# Patient Record
Sex: Female | Born: 1965 | Race: White | Hispanic: No | Marital: Married | Smoking: Former smoker
Health system: Southern US, Community
[De-identification: ages and names within clinical notes are randomized; demographics above are authoritative.]

## PROBLEM LIST (undated history)

## (undated) DIAGNOSIS — E785 Hyperlipidemia, unspecified: Secondary | ICD-10-CM

## (undated) DIAGNOSIS — I10 Essential (primary) hypertension: Secondary | ICD-10-CM

## (undated) DIAGNOSIS — E119 Type 2 diabetes mellitus without complications: Secondary | ICD-10-CM

## (undated) DIAGNOSIS — E079 Disorder of thyroid, unspecified: Secondary | ICD-10-CM

## (undated) HISTORY — PX: CYSTECTOMY: SUR359

## (undated) HISTORY — DX: Disorder of thyroid, unspecified: E07.9

## (undated) HISTORY — PX: ABDOMINAL HYSTERECTOMY: SHX81

## (undated) HISTORY — PX: BREAST BIOPSY: SHX20

---

## 2005-11-16 LAB — CONVERTED CEMR LAB

## 2007-01-25 ENCOUNTER — Ambulatory Visit: Payer: Self-pay | Admitting: Family Medicine

## 2007-01-25 DIAGNOSIS — K120 Recurrent oral aphthae: Secondary | ICD-10-CM | POA: Insufficient documentation

## 2007-01-25 DIAGNOSIS — E785 Hyperlipidemia, unspecified: Secondary | ICD-10-CM | POA: Insufficient documentation

## 2007-01-25 DIAGNOSIS — I10 Essential (primary) hypertension: Secondary | ICD-10-CM | POA: Insufficient documentation

## 2007-01-25 DIAGNOSIS — G43009 Migraine without aura, not intractable, without status migrainosus: Secondary | ICD-10-CM | POA: Insufficient documentation

## 2007-01-25 DIAGNOSIS — J309 Allergic rhinitis, unspecified: Secondary | ICD-10-CM | POA: Insufficient documentation

## 2007-01-25 DIAGNOSIS — M79609 Pain in unspecified limb: Secondary | ICD-10-CM | POA: Insufficient documentation

## 2007-01-25 DIAGNOSIS — E119 Type 2 diabetes mellitus without complications: Secondary | ICD-10-CM | POA: Insufficient documentation

## 2007-01-27 ENCOUNTER — Encounter: Payer: Self-pay | Admitting: Family Medicine

## 2007-01-28 ENCOUNTER — Ambulatory Visit: Payer: Self-pay | Admitting: Family Medicine

## 2007-01-28 DIAGNOSIS — R5383 Other fatigue: Secondary | ICD-10-CM

## 2007-01-28 DIAGNOSIS — R5381 Other malaise: Secondary | ICD-10-CM | POA: Insufficient documentation

## 2007-01-28 LAB — CONVERTED CEMR LAB
AST: 15 units/L (ref 0–37)
Albumin: 4.4 g/dL (ref 3.5–5.2)
BUN: 13 mg/dL (ref 6–23)
Calcium: 9 mg/dL (ref 8.4–10.5)
Chloride: 104 meq/L (ref 96–112)
Folate: 14.9 ng/mL
Glucose, Bld: 118 mg/dL — ABNORMAL HIGH (ref 70–99)
HDL: 40 mg/dL (ref >39)
Hemoglobin: 14.1 g/dL (ref 12.0–15.0)
Hgb A1c MFr Bld: 6 % (ref 4.6–6.1)
Potassium: 4.6 meq/L (ref 3.5–5.3)
RBC: 5.07 M/uL (ref 3.87–5.11)
RDW: 13.3 % (ref 11.5–14.0)
TSH: 5.328 microintl units/mL (ref 0.350–5.50)
Total Protein: 7.8 g/dL (ref 6.0–8.3)

## 2007-02-22 ENCOUNTER — Ambulatory Visit: Payer: Self-pay | Admitting: Family Medicine

## 2007-02-22 DIAGNOSIS — L919 Hypertrophic disorder of the skin, unspecified: Secondary | ICD-10-CM

## 2007-02-22 DIAGNOSIS — G2581 Restless legs syndrome: Secondary | ICD-10-CM | POA: Insufficient documentation

## 2007-02-22 DIAGNOSIS — L909 Atrophic disorder of skin, unspecified: Secondary | ICD-10-CM | POA: Insufficient documentation

## 2007-03-11 ENCOUNTER — Ambulatory Visit: Payer: Self-pay | Admitting: Family Medicine

## 2007-04-25 ENCOUNTER — Ambulatory Visit: Payer: Self-pay | Admitting: Family Medicine

## 2007-04-25 DIAGNOSIS — N390 Urinary tract infection, site not specified: Secondary | ICD-10-CM | POA: Insufficient documentation

## 2007-04-25 LAB — CONVERTED CEMR LAB
Protein, U semiquant: 100
Specific Gravity, Urine: >=1.03
pH: 5.5

## 2007-04-28 ENCOUNTER — Telehealth: Payer: Self-pay | Admitting: Family Medicine

## 2007-05-10 ENCOUNTER — Ambulatory Visit: Payer: Self-pay | Admitting: Family Medicine

## 2007-05-10 DIAGNOSIS — T753XXA Motion sickness, initial encounter: Secondary | ICD-10-CM | POA: Insufficient documentation

## 2007-05-27 ENCOUNTER — Encounter: Payer: Self-pay | Admitting: Family Medicine

## 2007-07-01 ENCOUNTER — Ambulatory Visit: Payer: Self-pay | Admitting: Family Medicine

## 2007-07-01 DIAGNOSIS — J019 Acute sinusitis, unspecified: Secondary | ICD-10-CM | POA: Insufficient documentation

## 2007-07-01 DIAGNOSIS — G43719 Chronic migraine without aura, intractable, without status migrainosus: Secondary | ICD-10-CM | POA: Insufficient documentation

## 2007-07-11 ENCOUNTER — Telehealth: Payer: Self-pay | Admitting: Family Medicine

## 2007-07-20 ENCOUNTER — Ambulatory Visit: Payer: Self-pay | Admitting: Family Medicine

## 2007-07-20 DIAGNOSIS — R350 Frequency of micturition: Secondary | ICD-10-CM | POA: Insufficient documentation

## 2007-07-20 LAB — CONVERTED CEMR LAB
Bilirubin Urine: NEGATIVE
Glucose, Urine, Semiquant: NEGATIVE
Ketones, urine, test strip: NEGATIVE
Specific Gravity, Urine: >=1.03
pH: 6

## 2007-08-16 ENCOUNTER — Encounter: Payer: Self-pay | Admitting: Family Medicine

## 2007-09-24 ENCOUNTER — Encounter: Payer: Self-pay | Admitting: Family Medicine

## 2007-09-24 LAB — CONVERTED CEMR LAB
HDL: 40 mg/dL
LDL Cholesterol: 92 mg/dL
T4, Total: 7.6 ug/dL
TSH: 4.957 microintl units/mL
Triglycerides: 173 mg/dL

## 2007-09-29 ENCOUNTER — Ambulatory Visit: Payer: Self-pay | Admitting: Family Medicine

## 2007-09-29 DIAGNOSIS — E039 Hypothyroidism, unspecified: Secondary | ICD-10-CM | POA: Insufficient documentation

## 2007-10-11 ENCOUNTER — Encounter: Payer: Self-pay | Admitting: Family Medicine

## 2008-03-23 ENCOUNTER — Telehealth (INDEPENDENT_AMBULATORY_CARE_PROVIDER_SITE_OTHER): Payer: Self-pay | Admitting: *Deleted

## 2008-05-03 ENCOUNTER — Encounter: Payer: Self-pay | Admitting: Family Medicine

## 2008-05-03 ENCOUNTER — Other Ambulatory Visit: Admission: RE | Admit: 2008-05-03 | Discharge: 2008-05-03 | Payer: Self-pay | Admitting: Family Medicine

## 2008-05-03 ENCOUNTER — Ambulatory Visit: Payer: Self-pay | Admitting: Family Medicine

## 2008-05-03 DIAGNOSIS — L219 Seborrheic dermatitis, unspecified: Secondary | ICD-10-CM | POA: Insufficient documentation

## 2008-05-03 DIAGNOSIS — N63 Unspecified lump in unspecified breast: Secondary | ICD-10-CM | POA: Insufficient documentation

## 2008-05-04 LAB — CONVERTED CEMR LAB
AST: 15 units/L (ref 0–37)
Albumin: 4.5 g/dL (ref 3.5–5.2)
Alkaline Phosphatase: 77 units/L (ref 39–117)
BUN: 12 mg/dL (ref 6–23)
Calcium: 9.1 mg/dL (ref 8.4–10.5)
Chloride: 104 meq/L (ref 96–112)
Glucose, Bld: 152 mg/dL — ABNORMAL HIGH (ref 70–99)
HDL: 40 mg/dL (ref >39)
LDL Cholesterol: 128 mg/dL — ABNORMAL HIGH (ref 0–99)
Potassium: 4.1 meq/L (ref 3.5–5.3)
Sodium: 138 meq/L (ref 135–145)
T3, Free: 3.5 pg/mL (ref 2.3–4.2)
TSH: 5.935 microintl units/mL — ABNORMAL HIGH (ref 0.350–4.50)
Total Protein: 7.6 g/dL (ref 6.0–8.3)
Triglycerides: 219 mg/dL — ABNORMAL HIGH (ref <150)

## 2008-05-11 ENCOUNTER — Encounter: Payer: Self-pay | Admitting: Family Medicine

## 2008-05-16 ENCOUNTER — Telehealth: Payer: Self-pay | Admitting: Family Medicine

## 2008-07-24 ENCOUNTER — Emergency Department (HOSPITAL_BASED_OUTPATIENT_CLINIC_OR_DEPARTMENT_OTHER): Admission: EM | Admit: 2008-07-24 | Discharge: 2008-07-25 | Payer: Self-pay | Admitting: Emergency Medicine

## 2008-07-25 ENCOUNTER — Ambulatory Visit: Payer: Self-pay | Admitting: Diagnostic Radiology

## 2008-07-26 ENCOUNTER — Telehealth: Payer: Self-pay | Admitting: Family Medicine

## 2008-07-27 ENCOUNTER — Ambulatory Visit: Payer: Self-pay | Admitting: Family Medicine

## 2008-07-27 DIAGNOSIS — G43909 Migraine, unspecified, not intractable, without status migrainosus: Secondary | ICD-10-CM | POA: Insufficient documentation

## 2008-07-27 DIAGNOSIS — I10 Essential (primary) hypertension: Secondary | ICD-10-CM | POA: Insufficient documentation

## 2008-07-30 ENCOUNTER — Ambulatory Visit: Payer: Self-pay | Admitting: Family Medicine

## 2008-08-18 ENCOUNTER — Ambulatory Visit: Payer: Self-pay | Admitting: Occupational Medicine

## 2009-12-01 IMAGING — CR DG CHEST 2V
2 series · 2 of 2 positions shown · non-contrast
Comparison: None available

CLINICAL DATA: Short of breath.  Migraine.  Nausea.

CHEST - 2 VIEW

[w chest pa]
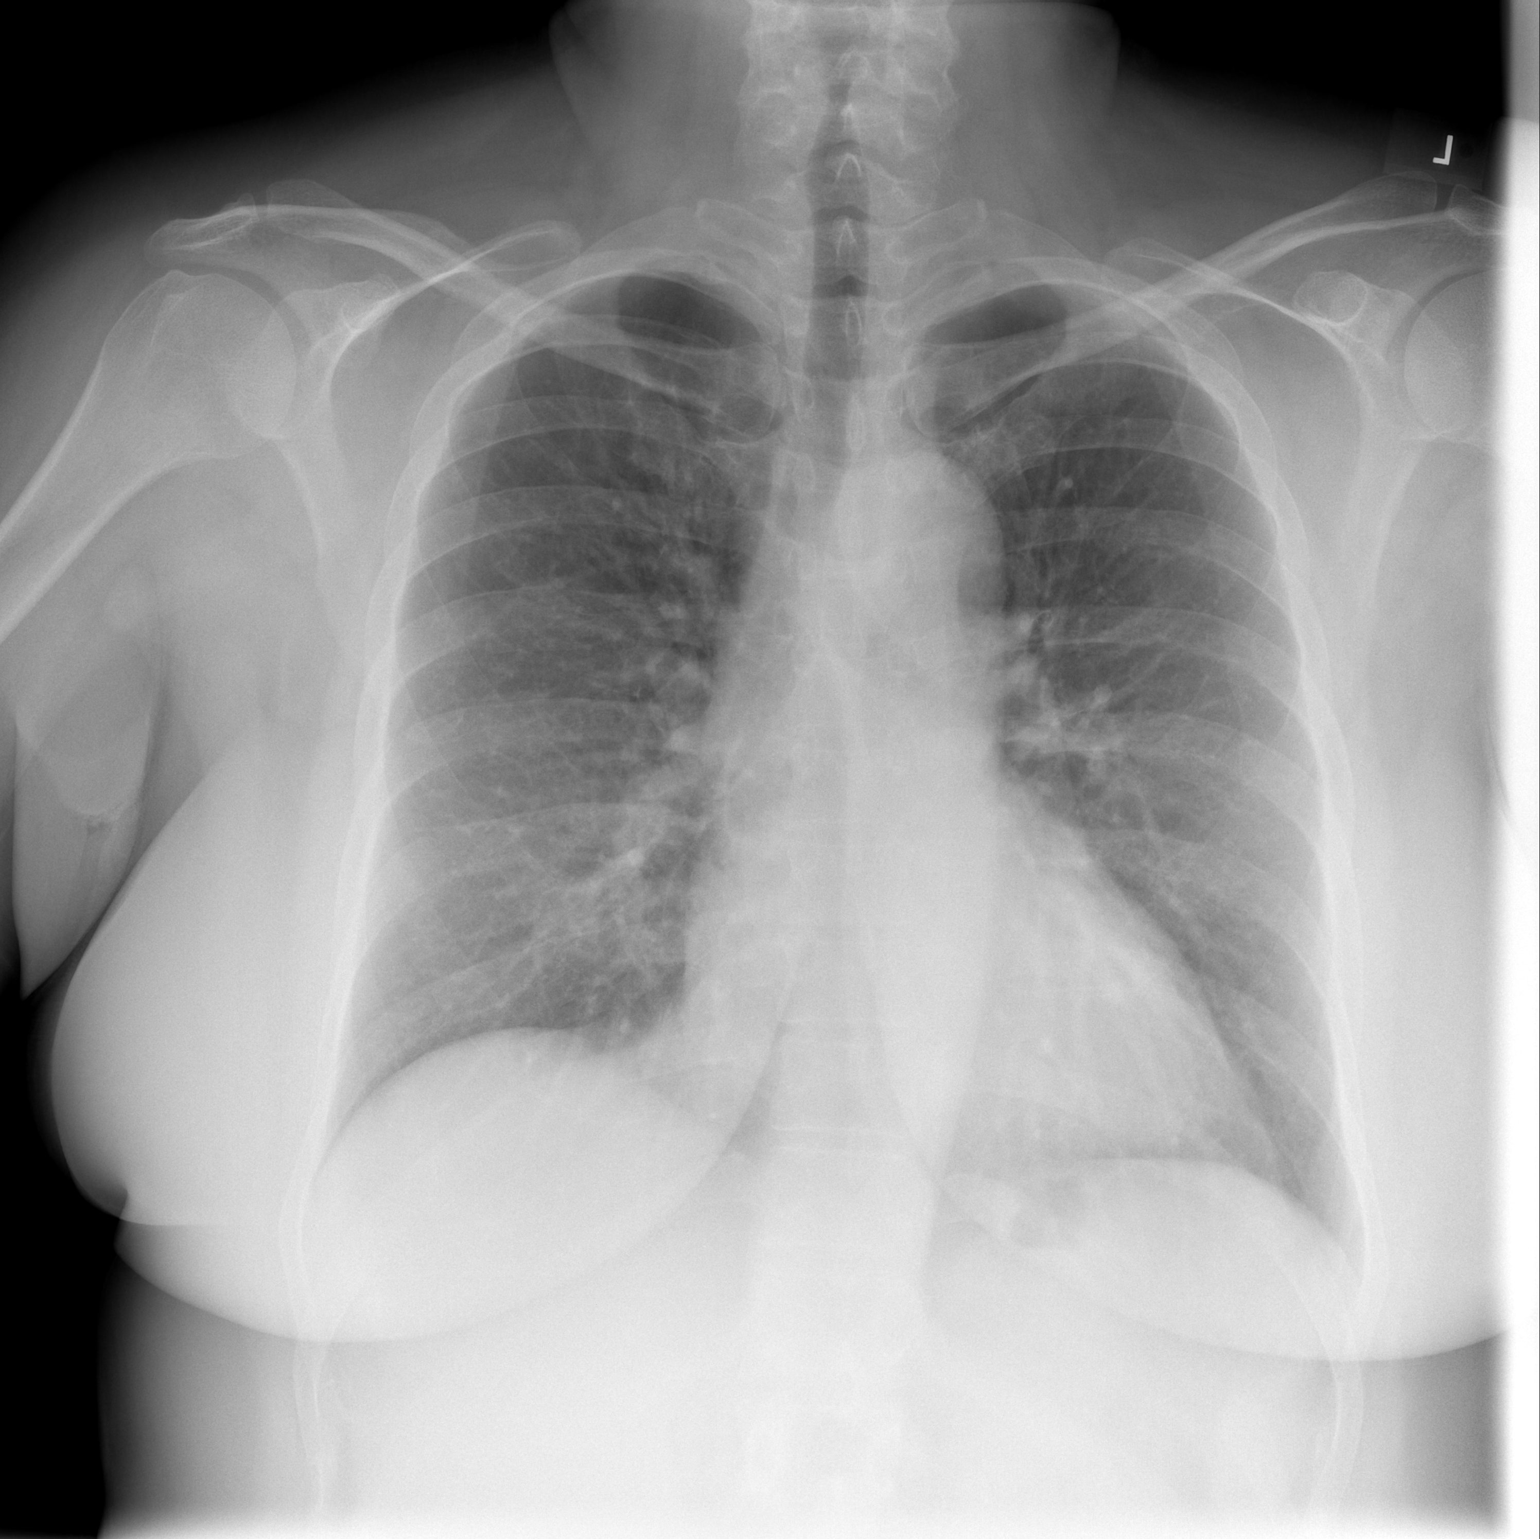

[w chest lat]
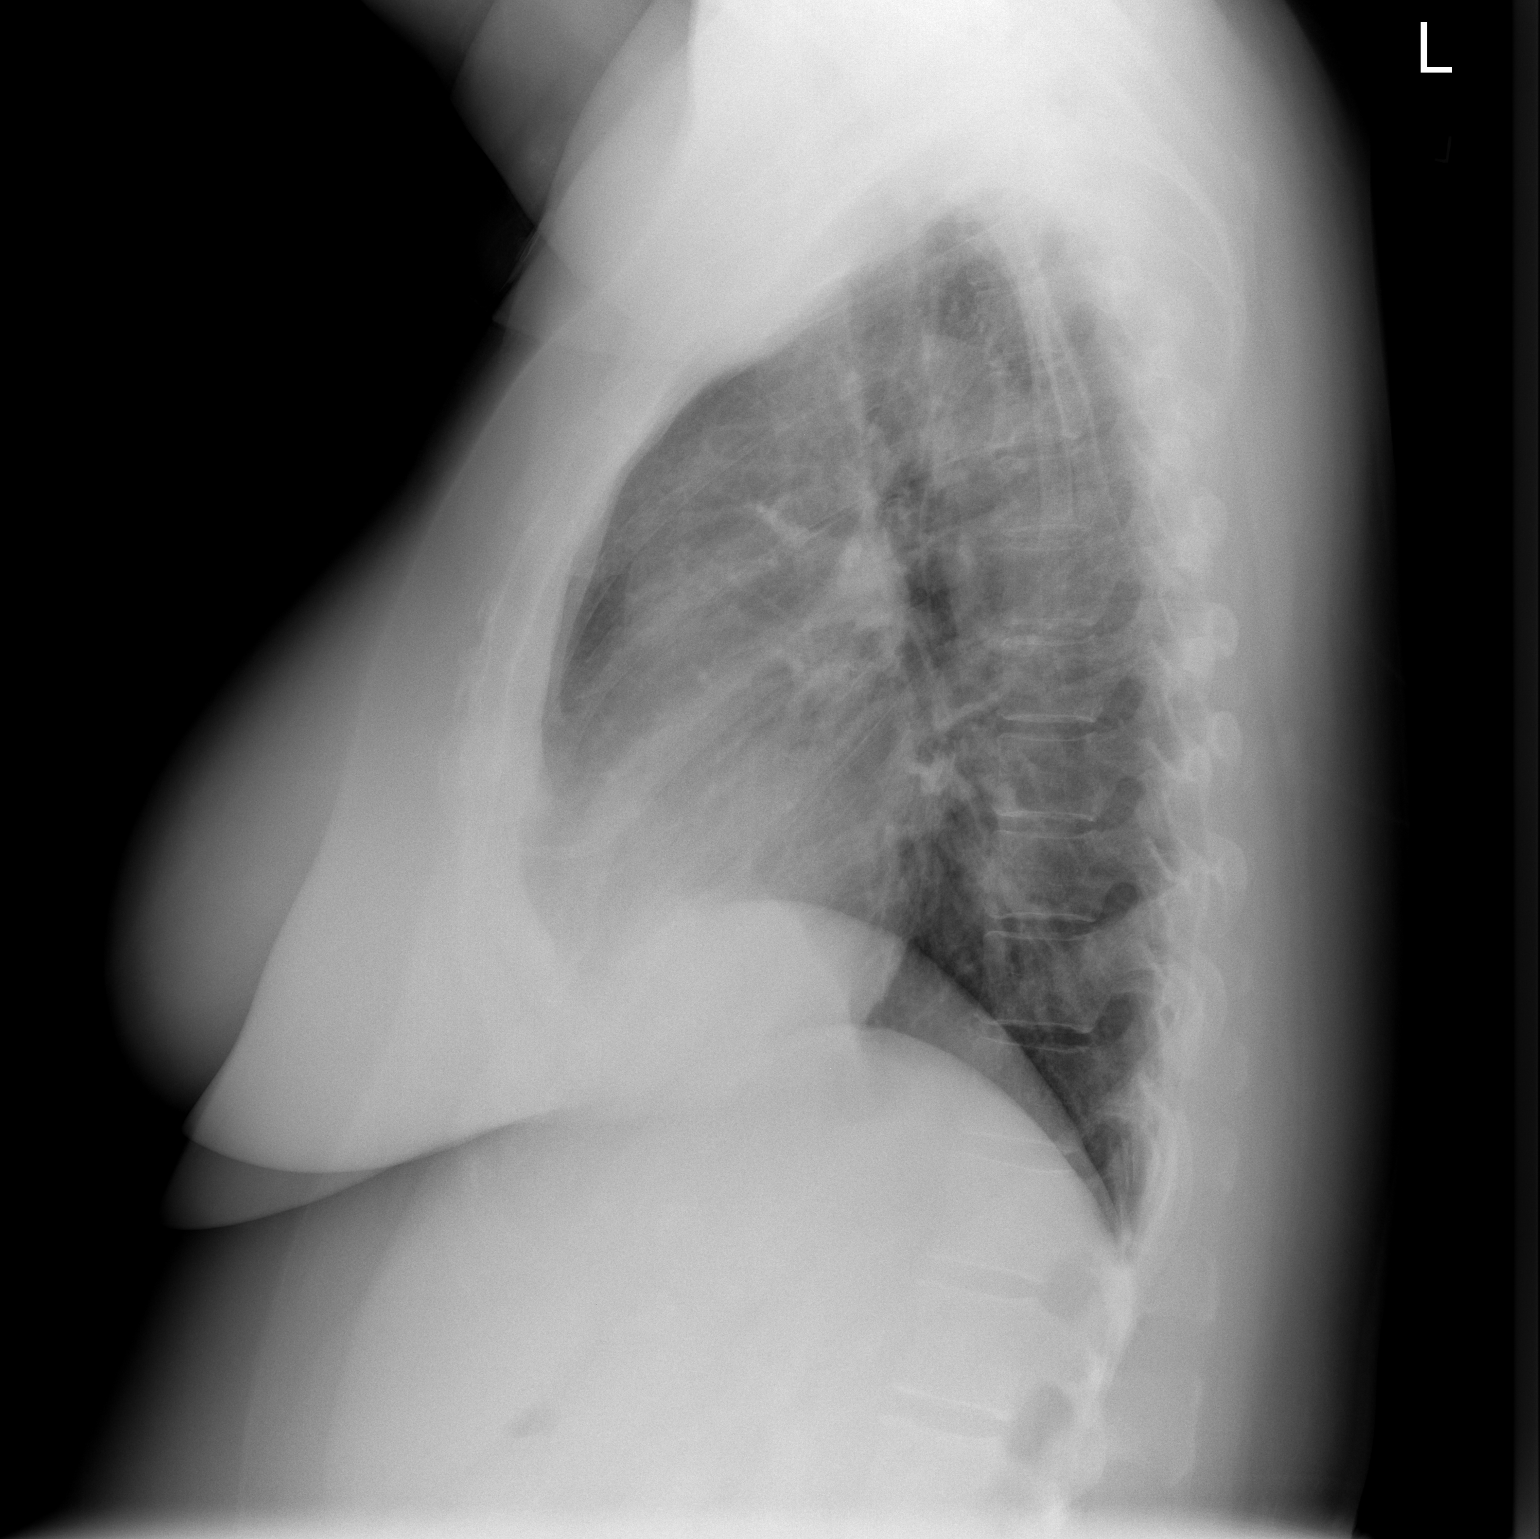

[2 of 2 positions shown; findings below may reference images not displayed]

FINDINGS: Chronic bronchitic changes are noted at the right lung
base.  No airspace disease or effusion.  Trachea midline.
Cardiomediastinal contours within normal limits.
IMPRESSION: 1.  Chronic bronchitic changes without acute cardiopulmonary
disease.

## 2009-12-06 ENCOUNTER — Ambulatory Visit: Payer: Self-pay | Admitting: Family Medicine

## 2009-12-06 DIAGNOSIS — J069 Acute upper respiratory infection, unspecified: Secondary | ICD-10-CM | POA: Insufficient documentation

## 2010-09-08 ENCOUNTER — Encounter: Payer: Self-pay | Admitting: Family Medicine

## 2010-09-17 NOTE — Assessment & Plan Note (Signed)
Summary: Sinus pressure, headache, cough/runny nose - clear x 5-7 dys    Vital Signs:  Patient Profile:   45 Years Old Female CC:      Cold & URI symptoms Height:     68 inches Weight:      264 pounds O2 Sat:      100 % O2 treatment:    Room Air Temp:     97.6 degrees F oral Pulse rate:   75 / minute Pulse rhythm:   regular Resp:     16 per minute BP sitting:   127 / 84  (right arm) Cuff size:   regular  Vitals Entered By: Areta Haber CMA (December 06, 2009 6:11 PM)                  Current Allergies: ! PCN ! SULFA METFORMIN HCL (METFORMIN HCL)      History of Present Illness Chief Complaint: Cold & URI symptoms History of Present Illness: Subjective: Patient complains of URI symptoms that started 5 days ago.  She has a history of multiple sinus infections in the past.  She also has had pneumonia in the past. + sore throat + cough No pleuritic pain No wheezing + nasal congestion + post-nasal drainage ? 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 (ICD-346.71) ACUTE SINUSITIS, UNSPECIFIED (ICD-461.9) MOTION SICKNESS (ICD-994.6) UTI (ICD-599.0) SKIN TAG (ICD-701.9) RESTLESS LEG SYNDROME (ICD-333.94) FATIGUE (ICD-780.79) DM, UNCOMPLICATED, TYPE II (ICD-250.00) LEG PAIN, BILATERAL (ICD-729.5) APHTHAE, ORAL (ICD-528.2) HYPERLIPIDEMIA NEC/NOS (ICD-272.4) COMMON MIGRAINE (ICD-346.10) ALLERGIC RHINITIS (ICD-477.9) HYPERTENSION, BENIGN ESSENTIAL (ICD-401.1)   Current  Meds LISINOPRIL-HYDROCHLOROTHIAZIDE 20-12.5 MG  TABS (LISINOPRIL-HYDROCHLOROTHIAZIDE) Take 1 tablet by mouth once a day VERAPAMIL HCL 120 MG TABS (VERAPAMIL HCL) Take one tablet by mouth once a day ACTOS 15 MG TABS (PIOGLITAZONE HCL) Take one tablet bymouth once a day VITAMIN B-12 1000 MCG TABS (CYANOCOBALAMIN) Take one capsule by mouth once a day MULTIVITAMIN/IRON 60 MG CHEW (PEDIATRIC MULTIVIT-MINERALS) Chew one tablet by mouth oice a day ZYRTEC 10 MG TABS (CETIRIZINE HCL) Take one tablet by mouth once a day IRON 25 MG TABS (IRON) Take one tablet bymouth once a day SM [facility] WORT 300 MG TABS ([facility] WORT) Take one tablet bymouth twice a day REQUIP 0.5 MG  TABS (ROPINIROLE HCL) 1/2 tab at bedtime for 2 days then 1 tab at bedtime for 5 days then 2 tabs at bedtime. IMITREX STATDOSE SYSTEM 6 MG/0.5ML KIT (SUMATRIPTAN SUCCINATE) INject 6mg  [place] x 1.  May repeat in 1 hour if HA recurs. Max 2 doses per 24 hours. TOPAMAX 50 MG TABS (TOPIRAMATE) 1 tab by mouth once daily AZITHROMYCIN 250 MG TABS (AZITHROMYCIN) Two tabs by mouth on day 1, then 1 tab daily on days 2 through 5 BENZONATATE 200 MG CAPS (BENZONATATE) One by mouth hs as needed cough  REVIEW OF SYSTEMS Constitutional Symptoms      Denies fever, chills, night sweats, weight loss, weight gain, and fatigue.  Eyes       Denies change in vision, eye pain, eye discharge, glasses, contact lenses, and eye surgery. Ear/Nose/Throat/Mouth       Complains of  frequent runny nose, sinus problems, and sore throat.      Denies hearing loss/aids, change in hearing, ear pain, ear discharge, dizziness, frequent nose bleeds, hoarseness, and tooth pain or bleeding.      Comments: clear x 5-6 dys Respiratory       Denies dry cough, productive cough, wheezing, shortness of breath, asthma, bronchitis, and emphysema/COPD.  Cardiovascular       Denies murmurs, chest pain, and tires easily with exhertion.    Gastrointestinal       Complains of nausea/vomiting.       Denies stomach pain, diarrhea, constipation, blood in bowel movements, and indigestion. Genitourniary       Denies painful urination, kidney stones, and loss of urinary control. Neurological       Complains of headaches.      Denies paralysis, seizures, and fainting/blackouts. Musculoskeletal       Denies muscle pain, joint pain, joint stiffness, decreased range of motion, redness, swelling, muscle weakness, and gout.  Skin       Denies bruising, unusual mles/lumps or sores, and hair/skin or nail changes.  Psych       Denies mood changes, temper/anger issues, anxiety/stress, speech problems, depression, and sleep problems. Other Comments: Pt has not seen PCP for this.   Past History:  Past Medical History: Last updated: 07/27/2008 Hypertension Borderline Diabetes Migraine  Past Surgical History: Last updated: 01/25/2007 Hysterectomy for cervical Cancer in 10/2004 Cyst removed from Right foot in 1980  Family History: Last updated: 07/30/2008 Father with Colon Ca age 65 Mother with Lung Ca-Smoker Sister with lupus. 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  HS diploma and some college.  Married to [employer] with no children.   Former Smoker Alcohol use-yes Drug use-no Regular exercise-no  Risk Factors: Alcohol Use: 0 (01/25/2007) Caffeine Use: 2 (01/25/2007) Exercise: no (01/25/2007)  Risk Factors: Smoking Status: quit (01/25/2007)   Objective:  No acute distress  Eyes:  Pupils are equal, round, and reactive to light and accomdation.  Extraocular movement is intact.  Conjunctivae are not inflamed.  Ears:  Canals normal.  Tympanic membranes normal.   Nose:  Normal septum.  Normal turbinates, mildly congested.   No sinus tenderness present.  Pharynx:  Normal  Neck:  Supple.  No adenopathy is present.  No thyromegaly is present  Lungs:  Clear to auscultation.  Breath  sounds are equal.  Heart:  Regular rate and rhythm without murmurs, rubs, or gallops.  Abdomen:  Nontender without masses or hepatosplenomegaly.  Bowel sounds are present.  No CVA or flank tenderness.  Extremities:  No edema.   Skin:  No rash Assessment New Problems: URI (ICD-465.9)  SUSPECT VIRAL URI; ? SINUSITIS  Plan New Medications/Changes: BENZONATATE 200 MG CAPS (BENZONATATE) One by mouth hs as needed cough  #12 x 0, 12/06/2009, Donna Christen MD AZITHROMYCIN 250 MG TABS (AZITHROMYCIN) Two tabs by mouth on day 1, then 1 tab daily on days 2 through 5  #6 tabs x 0, 12/06/2009, Donna Christen MD  New Orders: Est. Patient Level III [phone] Planning Comments:   Begin expectorant, cough suppressant at bedtime, Z-pack. Follow-up with PCP if not improving.   The patient and/or caregiver has been counseled thoroughly with regard to medications prescribed including dosage, schedule, interactions, rationale for use, and possible side effects and they verbalize understanding.  Diagnoses and expected course of recovery discussed and will return if not improved  as expected or if the condition worsens. Patient and/or caregiver verbalized understanding.  Prescriptions: BENZONATATE 200 MG CAPS (BENZONATATE) One by mouth hs as needed cough  #12 x 0   Entered and Authorized by:   Donna Christen MD   Signed by:   Donna Christen MD on 12/06/2009   Method used:   Print then Give to Patient   RxID:   [id number] AZITHROMYCIN 250 MG TABS (AZITHROMYCIN) Two tabs by mouth on day 1, then 1 tab daily on days 2 through 5  #6 tabs x 0   Entered and Authorized by:   Donna Christen MD   Signed by:   Donna Christen MD on 12/06/2009   Method used:   Print then Give to Patient   RxID:   [id number]   Patient Instructions: 1)  May use Mucinex (guaifenesin)  twice daily for congestion. 2)  Increase fluid intake, rest. 3)  May use Afrin nasal spray (or generic oxymetazoline) twice daily for about 5 days.   Also recommend using saline nasal spray several times daily and/or saline nasal irrigation. 4)  Followup with family doctor if not improving 7 to 10 days.

## 2013-10-07 ENCOUNTER — Emergency Department
Admission: EM | Admit: 2013-10-07 | Discharge: 2013-10-07 | Disposition: A | Discharge status: Home / Self Care | Payer: BC Managed Care – PPO | Source: Home / Self Care | Attending: Family Medicine | Admitting: Family Medicine

## 2013-10-07 ENCOUNTER — Encounter: Payer: Self-pay | Admitting: Emergency Medicine

## 2013-10-07 DIAGNOSIS — J069 Acute upper respiratory infection, unspecified: Secondary | ICD-10-CM

## 2013-10-07 DIAGNOSIS — J309 Allergic rhinitis, unspecified: Secondary | ICD-10-CM

## 2013-10-07 DIAGNOSIS — I10 Essential (primary) hypertension: Secondary | ICD-10-CM

## 2013-10-07 HISTORY — DX: Type 2 diabetes mellitus without complications: E11.9

## 2013-10-07 HISTORY — DX: Hyperlipidemia, unspecified: E78.5

## 2013-10-07 HISTORY — DX: Essential (primary) hypertension: I10

## 2013-10-07 MED ORDER — DOXYCYCLINE HYCLATE 100 MG PO CAPS: 100.0000 mg | ORAL_CAPSULE | Freq: Two times a day (BID) | ORAL | Status: DC

## 2013-10-07 MED ORDER — FLUTICASONE PROPIONATE 50 MCG/ACT NA SUSP: NASAL | Status: AC

## 2013-10-07 MED ORDER — PREDNISONE 20 MG PO TABS: 20.0000 mg | ORAL_TABLET | Freq: Two times a day (BID) | ORAL | Status: DC

## 2013-10-07 MED ORDER — BENZONATATE 200 MG PO CAPS: 200.0000 mg | ORAL_CAPSULE | Freq: Every day | ORAL | Status: DC

## 2013-10-07 NOTE — ED Notes (Signed)
Patient c/o coughing up blood and SOB this morning; states she was dx with bronchitis yesterday and is taking medications for this. Patient concerned about Blood pressure being high as well.

## 2013-10-07 NOTE — ED Provider Notes (Signed)
CSN: [id number]     Arrival date & time 10/07/13  1400 History   First MD Initiated Contact with Patient 10/07/13 1448     Chief Complaint  Patient presents with  . Cough  . Shortness of Breath  . Hypertension        HPI Comments: Patient complains of onset of sinus congestion and post-nasal drainage about 3 weeks ago that has persisted.  She recalls that her BP at that time was 132/97. She had been prescribed lisinopril/HCTZ in the past, but does not take antihypertensive medication now. She has a history of allergic rhinitis and has used Flonase in the past. Four days ago she developed increased sinus congestion with mild sore throat, cough, myalgias, and low grade fever.  She complains of tightness in her anterior chest and occasionally wheezes.  She was evaluated by a PCP yesterday.  Her flu test was negative, and her BP was 190/120.  She admits that she has been taking decongestants.  Her BP was 155/101 last night.  The history is provided by the patient.    Past Medical History  Diagnosis Date  . Diabetes mellitus without complication   . Hypertension   . Hyperlipidemia    Past Surgical History  Procedure Laterality Date  . Abdominal hysterectomy     Family History  Problem Relation Age of Onset  . Cancer Other   . Hypertension Other   . Diabetes Other   . Hyperlipidemia Other    History  Substance Use Topics  . Smoking status: Former Games developer  . Smokeless tobacco: Never Used  . Alcohol Use: No   OB History   Grav Para Term Preterm Abortions TAB SAB Ect Mult Living                 Review of Systems + sore throat + cough No pleuritic pain + wheezing + nasal congestion + post-nasal drainage ? sinus pain/pressure No itchy/red eyes No earache No hemoptysis + SOB + fever, + chills No nausea No vomiting No abdominal pain No diarrhea No urinary symptoms No skin rash + fatigue + myalgias + headache Used OTC meds without relief    Allergies   Codeine; Metformin; Penicillins; and Sulfonamide derivatives  Home Medications   Current Outpatient Rx  Name  Route  Sig  Dispense  Refill  . benzonatate (TESSALON) 200 MG capsule   Oral   Take 1 capsule (200 mg total) by mouth at bedtime. Take as needed for cough   12 capsule   0   . doxycycline (VIBRAMYCIN) 100 MG capsule   Oral   Take 1 capsule (100 mg total) by mouth 2 (two) times daily. (Rx void after 10/15/13)   20 capsule   0   . fluticasone (FLONASE) 50 MCG/ACT nasal spray      Place two sprays in each nostril once daily   16 g   1   . predniSONE (DELTASONE) 20 MG tablet   Oral   Take 1 tablet (20 mg total) by mouth 2 (two) times daily. Take with food.   10 tablet   0    BP 179/101  Pulse 95  Temp(Src) 98 F (36.7 C)  Resp 16  Ht 5\' 8"  (1.727 m)  Wt 243 lb (110.224 kg)  BMI 36.96 kg/m2  SpO2 98% Physical Exam Nursing notes and Vital Signs reviewed. Appearance:  Patient appears stated age, and in no acute distress.  Patient is obese (BMI 37.0) Eyes:  Pupils are equal,  round, and reactive to light and accomodation.  Extraocular movement is intact.  Conjunctivae are not inflamed  Ears:  Canals normal.  Tympanic membranes normal.  Nose: Congested turbinates.  No sinus tenderness.  Pharynx:  Normal Neck:  Supple. Tender shotty posterior nodes are palpated bilaterally  Lungs:  Clear to auscultation.  Breath sounds are equal.  Chest:  Distinct tenderness to palpation over the mid-sternum.  Heart:  Regular rate and rhythm without murmurs, rubs, or gallops.  Abdomen:  Nontender without masses or hepatosplenomegaly.  Bowel sounds are present.  No CVA or flank tenderness.  Extremities:  No edema.  No calf tenderness Skin:  No rash present.   ED Course  Procedures  none       MDM   Final diagnoses:  Acute upper respiratory infections of unspecified site; suspect new onset viral URI  Allergic rhinitis  Essential hypertension, benign     Begin Prednisone  burst.  Resume Flonase spray.  Prescription written for Benzonatate [facility](Tessalon) to take at bedtime for night-time cough.  Take plain Mucinex (1200 mg guaifenesin) twice daily for cough and congestion.   Increase fluid intake, rest. May use Afrin nasal spray (or generic oxymetazoline) twice daily for about 5 days.  Also recommend using saline nasal spray several times daily and saline nasal irrigation (AYR is a common brand).  Use Flonase spray after saline rinse and Afrin spray. Try warm salt water gargles for sore throat.  Stop all antihistamines for now, and other non-prescription cough/cold preparations. May take Tylenol for headache. Begin doxycycline if not improving about 5 days or if persistent fever develops (Given a prescription to hold, with an expiration date)  Follow-up with family doctor if BP remains elevated.    Lattie HawStephen A Beese, MD 10/08/13 [phone]

## 2013-10-07 NOTE — Discharge Instructions (Signed)
Take plain Mucinex (1200 mg guaifenesin) twice daily for cough and congestion.   Increase fluid intake, rest. May use Afrin nasal spray (or generic oxymetazoline) twice daily for about 5 days.  Also recommend using saline nasal spray several times daily and saline nasal irrigation (AYR is a common brand).  Use Flonase spray after saline rinse and Afrin spray. Try warm salt water gargles for sore throat.  Stop all antihistamines for now, and other non-prescription cough/cold preparations. May take Tylenol for headache. Begin doxycycline if not improving about 5 days or if persistent fever develops   Follow-up with family doctor if BP remains elevated.

## 2020-01-30 DIAGNOSIS — R000 Tachycardia, unspecified: Secondary | ICD-10-CM | POA: Insufficient documentation

## 2020-01-30 DIAGNOSIS — R0789 Other chest pain: Secondary | ICD-10-CM | POA: Insufficient documentation

## 2020-01-30 DIAGNOSIS — R0609 Other forms of dyspnea: Secondary | ICD-10-CM | POA: Insufficient documentation

## 2020-03-20 DIAGNOSIS — I499 Cardiac arrhythmia, unspecified: Secondary | ICD-10-CM | POA: Diagnosis not present

## 2020-03-20 DIAGNOSIS — Z7982 Long term (current) use of aspirin: Secondary | ICD-10-CM | POA: Diagnosis not present

## 2020-03-20 DIAGNOSIS — E669 Obesity, unspecified: Secondary | ICD-10-CM | POA: Diagnosis not present

## 2020-03-20 DIAGNOSIS — Z888 Allergy status to other drugs, medicaments and biological substances status: Secondary | ICD-10-CM | POA: Diagnosis not present

## 2020-03-20 DIAGNOSIS — R0602 Shortness of breath: Secondary | ICD-10-CM | POA: Diagnosis not present

## 2020-03-20 DIAGNOSIS — R0600 Dyspnea, unspecified: Secondary | ICD-10-CM | POA: Diagnosis not present

## 2020-03-20 DIAGNOSIS — I10 Essential (primary) hypertension: Secondary | ICD-10-CM | POA: Diagnosis not present

## 2020-03-20 DIAGNOSIS — Z880 Allergy status to penicillin: Secondary | ICD-10-CM | POA: Diagnosis not present

## 2020-03-20 DIAGNOSIS — R079 Chest pain, unspecified: Secondary | ICD-10-CM | POA: Diagnosis not present

## 2020-03-20 DIAGNOSIS — G44229 Chronic tension-type headache, not intractable: Secondary | ICD-10-CM | POA: Diagnosis not present

## 2020-03-20 DIAGNOSIS — F419 Anxiety disorder, unspecified: Secondary | ICD-10-CM | POA: Diagnosis not present

## 2020-03-20 DIAGNOSIS — E785 Hyperlipidemia, unspecified: Secondary | ICD-10-CM | POA: Diagnosis not present

## 2020-03-20 DIAGNOSIS — Z79899 Other long term (current) drug therapy: Secondary | ICD-10-CM | POA: Diagnosis not present

## 2020-03-20 DIAGNOSIS — E079 Disorder of thyroid, unspecified: Secondary | ICD-10-CM | POA: Diagnosis not present

## 2020-03-20 DIAGNOSIS — R0789 Other chest pain: Secondary | ICD-10-CM | POA: Diagnosis not present

## 2020-03-20 DIAGNOSIS — R002 Palpitations: Secondary | ICD-10-CM | POA: Diagnosis not present

## 2020-03-20 DIAGNOSIS — Z87891 Personal history of nicotine dependence: Secondary | ICD-10-CM | POA: Diagnosis not present

## 2020-03-20 DIAGNOSIS — E119 Type 2 diabetes mellitus without complications: Secondary | ICD-10-CM | POA: Diagnosis not present

## 2020-03-20 DIAGNOSIS — R918 Other nonspecific abnormal finding of lung field: Secondary | ICD-10-CM | POA: Diagnosis not present

## 2020-03-20 DIAGNOSIS — Z882 Allergy status to sulfonamides status: Secondary | ICD-10-CM | POA: Diagnosis not present

## 2020-03-20 DIAGNOSIS — R9431 Abnormal electrocardiogram [ECG] [EKG]: Secondary | ICD-10-CM | POA: Diagnosis not present

## 2020-03-20 DIAGNOSIS — Z7984 Long term (current) use of oral hypoglycemic drugs: Secondary | ICD-10-CM | POA: Diagnosis not present

## 2020-03-21 DIAGNOSIS — G44229 Chronic tension-type headache, not intractable: Secondary | ICD-10-CM | POA: Diagnosis not present

## 2020-03-21 DIAGNOSIS — E669 Obesity, unspecified: Secondary | ICD-10-CM | POA: Diagnosis not present

## 2020-03-21 DIAGNOSIS — F419 Anxiety disorder, unspecified: Secondary | ICD-10-CM | POA: Diagnosis not present

## 2020-03-21 DIAGNOSIS — E119 Type 2 diabetes mellitus without complications: Secondary | ICD-10-CM | POA: Diagnosis not present

## 2020-03-21 DIAGNOSIS — R9431 Abnormal electrocardiogram [ECG] [EKG]: Secondary | ICD-10-CM | POA: Diagnosis not present

## 2020-03-25 DIAGNOSIS — E669 Obesity, unspecified: Secondary | ICD-10-CM | POA: Diagnosis not present

## 2020-03-25 DIAGNOSIS — I10 Essential (primary) hypertension: Secondary | ICD-10-CM | POA: Diagnosis not present

## 2020-03-25 DIAGNOSIS — R0602 Shortness of breath: Secondary | ICD-10-CM | POA: Diagnosis not present

## 2020-03-25 DIAGNOSIS — G4733 Obstructive sleep apnea (adult) (pediatric): Secondary | ICD-10-CM | POA: Diagnosis not present

## 2020-03-25 DIAGNOSIS — Z9989 Dependence on other enabling machines and devices: Secondary | ICD-10-CM | POA: Diagnosis not present

## 2020-03-26 DIAGNOSIS — G4733 Obstructive sleep apnea (adult) (pediatric): Secondary | ICD-10-CM | POA: Diagnosis not present

## 2020-04-02 DIAGNOSIS — I10 Essential (primary) hypertension: Secondary | ICD-10-CM | POA: Diagnosis not present

## 2020-04-02 DIAGNOSIS — G43719 Chronic migraine without aura, intractable, without status migrainosus: Secondary | ICD-10-CM | POA: Diagnosis not present

## 2020-04-02 DIAGNOSIS — E1165 Type 2 diabetes mellitus with hyperglycemia: Secondary | ICD-10-CM | POA: Diagnosis not present

## 2020-04-11 DIAGNOSIS — M545 Low back pain: Secondary | ICD-10-CM | POA: Diagnosis not present

## 2020-04-11 DIAGNOSIS — R200 Anesthesia of skin: Secondary | ICD-10-CM | POA: Diagnosis not present

## 2020-04-11 DIAGNOSIS — R29818 Other symptoms and signs involving the nervous system: Secondary | ICD-10-CM | POA: Diagnosis not present

## 2020-04-17 DIAGNOSIS — I10 Essential (primary) hypertension: Secondary | ICD-10-CM | POA: Diagnosis not present

## 2020-04-17 DIAGNOSIS — S299XXA Unspecified injury of thorax, initial encounter: Secondary | ICD-10-CM | POA: Diagnosis not present

## 2020-04-17 DIAGNOSIS — M549 Dorsalgia, unspecified: Secondary | ICD-10-CM | POA: Diagnosis not present

## 2020-04-17 DIAGNOSIS — M5414 Radiculopathy, thoracic region: Secondary | ICD-10-CM | POA: Diagnosis not present

## 2020-04-18 DIAGNOSIS — M47816 Spondylosis without myelopathy or radiculopathy, lumbar region: Secondary | ICD-10-CM | POA: Diagnosis not present

## 2020-04-18 DIAGNOSIS — M461 Sacroiliitis, not elsewhere classified: Secondary | ICD-10-CM | POA: Diagnosis not present

## 2020-04-18 DIAGNOSIS — B0229 Other postherpetic nervous system involvement: Secondary | ICD-10-CM | POA: Diagnosis not present

## 2020-04-23 DIAGNOSIS — E113291 Type 2 diabetes mellitus with mild nonproliferative diabetic retinopathy without macular edema, right eye: Secondary | ICD-10-CM | POA: Diagnosis not present

## 2020-04-27 DIAGNOSIS — R300 Dysuria: Secondary | ICD-10-CM | POA: Diagnosis not present

## 2020-05-07 DIAGNOSIS — B0229 Other postherpetic nervous system involvement: Secondary | ICD-10-CM | POA: Diagnosis not present

## 2020-05-10 DIAGNOSIS — R201 Hypoesthesia of skin: Secondary | ICD-10-CM | POA: Diagnosis not present

## 2020-05-10 DIAGNOSIS — M545 Low back pain: Secondary | ICD-10-CM | POA: Diagnosis not present

## 2020-05-10 DIAGNOSIS — R29818 Other symptoms and signs involving the nervous system: Secondary | ICD-10-CM | POA: Diagnosis not present

## 2020-05-16 DIAGNOSIS — I10 Essential (primary) hypertension: Secondary | ICD-10-CM | POA: Diagnosis not present

## 2020-05-16 DIAGNOSIS — R0789 Other chest pain: Secondary | ICD-10-CM | POA: Diagnosis not present

## 2020-05-16 DIAGNOSIS — R0600 Dyspnea, unspecified: Secondary | ICD-10-CM | POA: Diagnosis not present

## 2020-05-16 DIAGNOSIS — R002 Palpitations: Secondary | ICD-10-CM | POA: Diagnosis not present

## 2020-05-21 DIAGNOSIS — I10 Essential (primary) hypertension: Secondary | ICD-10-CM | POA: Diagnosis not present

## 2020-05-21 DIAGNOSIS — E039 Hypothyroidism, unspecified: Secondary | ICD-10-CM | POA: Diagnosis not present

## 2020-05-21 DIAGNOSIS — Z79899 Other long term (current) drug therapy: Secondary | ICD-10-CM | POA: Diagnosis not present

## 2020-05-21 DIAGNOSIS — E1165 Type 2 diabetes mellitus with hyperglycemia: Secondary | ICD-10-CM | POA: Diagnosis not present

## 2020-05-21 DIAGNOSIS — E785 Hyperlipidemia, unspecified: Secondary | ICD-10-CM | POA: Diagnosis not present

## 2020-05-22 DIAGNOSIS — M5414 Radiculopathy, thoracic region: Secondary | ICD-10-CM | POA: Diagnosis not present

## 2020-05-22 DIAGNOSIS — M549 Dorsalgia, unspecified: Secondary | ICD-10-CM | POA: Diagnosis not present

## 2020-05-28 DIAGNOSIS — Z880 Allergy status to penicillin: Secondary | ICD-10-CM | POA: Diagnosis not present

## 2020-05-28 DIAGNOSIS — Z79899 Other long term (current) drug therapy: Secondary | ICD-10-CM | POA: Diagnosis not present

## 2020-05-28 DIAGNOSIS — I10 Essential (primary) hypertension: Secondary | ICD-10-CM | POA: Diagnosis not present

## 2020-05-28 DIAGNOSIS — E119 Type 2 diabetes mellitus without complications: Secondary | ICD-10-CM | POA: Diagnosis not present

## 2020-05-28 DIAGNOSIS — Z882 Allergy status to sulfonamides status: Secondary | ICD-10-CM | POA: Diagnosis not present

## 2020-05-28 DIAGNOSIS — Z8669 Personal history of other diseases of the nervous system and sense organs: Secondary | ICD-10-CM | POA: Diagnosis not present

## 2020-05-28 DIAGNOSIS — G4700 Insomnia, unspecified: Secondary | ICD-10-CM | POA: Diagnosis not present

## 2020-05-28 DIAGNOSIS — Z79890 Hormone replacement therapy (postmenopausal): Secondary | ICD-10-CM | POA: Diagnosis not present

## 2020-05-28 DIAGNOSIS — E785 Hyperlipidemia, unspecified: Secondary | ICD-10-CM | POA: Diagnosis not present

## 2020-05-28 DIAGNOSIS — Z86010 Personal history of colonic polyps: Secondary | ICD-10-CM | POA: Diagnosis not present

## 2020-05-28 DIAGNOSIS — R0602 Shortness of breath: Secondary | ICD-10-CM | POA: Diagnosis not present

## 2020-05-28 DIAGNOSIS — Z7982 Long term (current) use of aspirin: Secondary | ICD-10-CM | POA: Diagnosis not present

## 2020-05-28 DIAGNOSIS — Z888 Allergy status to other drugs, medicaments and biological substances status: Secondary | ICD-10-CM | POA: Diagnosis not present

## 2020-05-28 DIAGNOSIS — Z7984 Long term (current) use of oral hypoglycemic drugs: Secondary | ICD-10-CM | POA: Diagnosis not present

## 2020-05-28 DIAGNOSIS — E079 Disorder of thyroid, unspecified: Secondary | ICD-10-CM | POA: Diagnosis not present

## 2020-05-28 DIAGNOSIS — U071 COVID-19: Secondary | ICD-10-CM | POA: Diagnosis not present

## 2020-05-28 DIAGNOSIS — Z87891 Personal history of nicotine dependence: Secondary | ICD-10-CM | POA: Diagnosis not present

## 2020-05-30 DIAGNOSIS — R197 Diarrhea, unspecified: Secondary | ICD-10-CM | POA: Diagnosis not present

## 2020-05-30 DIAGNOSIS — R110 Nausea: Secondary | ICD-10-CM | POA: Diagnosis not present

## 2020-05-30 DIAGNOSIS — U071 COVID-19: Secondary | ICD-10-CM | POA: Diagnosis not present

## 2020-05-30 DIAGNOSIS — K219 Gastro-esophageal reflux disease without esophagitis: Secondary | ICD-10-CM | POA: Diagnosis not present

## 2020-06-06 DIAGNOSIS — E1165 Type 2 diabetes mellitus with hyperglycemia: Secondary | ICD-10-CM | POA: Diagnosis not present

## 2020-06-06 DIAGNOSIS — F410 Panic disorder [episodic paroxysmal anxiety] without agoraphobia: Secondary | ICD-10-CM | POA: Diagnosis not present

## 2020-06-06 DIAGNOSIS — F411 Generalized anxiety disorder: Secondary | ICD-10-CM | POA: Diagnosis not present

## 2020-06-06 DIAGNOSIS — G43719 Chronic migraine without aura, intractable, without status migrainosus: Secondary | ICD-10-CM | POA: Diagnosis not present

## 2020-06-19 DIAGNOSIS — I10 Essential (primary) hypertension: Secondary | ICD-10-CM | POA: Diagnosis not present

## 2020-06-19 DIAGNOSIS — L409 Psoriasis, unspecified: Secondary | ICD-10-CM | POA: Diagnosis not present

## 2020-06-27 DIAGNOSIS — I10 Essential (primary) hypertension: Secondary | ICD-10-CM | POA: Diagnosis not present

## 2020-06-27 DIAGNOSIS — E785 Hyperlipidemia, unspecified: Secondary | ICD-10-CM | POA: Diagnosis not present

## 2020-06-27 DIAGNOSIS — F411 Generalized anxiety disorder: Secondary | ICD-10-CM | POA: Insufficient documentation

## 2020-06-27 DIAGNOSIS — E1165 Type 2 diabetes mellitus with hyperglycemia: Secondary | ICD-10-CM | POA: Diagnosis not present

## 2020-07-02 DIAGNOSIS — H6593 Unspecified nonsuppurative otitis media, bilateral: Secondary | ICD-10-CM | POA: Diagnosis not present

## 2020-07-02 DIAGNOSIS — H9193 Unspecified hearing loss, bilateral: Secondary | ICD-10-CM | POA: Diagnosis not present

## 2020-09-30 DIAGNOSIS — I10 Essential (primary) hypertension: Secondary | ICD-10-CM | POA: Diagnosis not present

## 2020-09-30 DIAGNOSIS — R1012 Left upper quadrant pain: Secondary | ICD-10-CM | POA: Diagnosis not present

## 2020-09-30 DIAGNOSIS — E039 Hypothyroidism, unspecified: Secondary | ICD-10-CM | POA: Diagnosis not present

## 2020-09-30 DIAGNOSIS — E1165 Type 2 diabetes mellitus with hyperglycemia: Secondary | ICD-10-CM | POA: Diagnosis not present

## 2020-09-30 DIAGNOSIS — E785 Hyperlipidemia, unspecified: Secondary | ICD-10-CM | POA: Diagnosis not present

## 2020-10-02 DIAGNOSIS — R110 Nausea: Secondary | ICD-10-CM | POA: Diagnosis not present

## 2020-10-02 DIAGNOSIS — K219 Gastro-esophageal reflux disease without esophagitis: Secondary | ICD-10-CM | POA: Diagnosis not present

## 2020-10-02 DIAGNOSIS — R1012 Left upper quadrant pain: Secondary | ICD-10-CM | POA: Diagnosis not present

## 2020-10-02 DIAGNOSIS — R1013 Epigastric pain: Secondary | ICD-10-CM | POA: Diagnosis not present

## 2020-10-07 DIAGNOSIS — R1013 Epigastric pain: Secondary | ICD-10-CM | POA: Diagnosis not present

## 2020-10-09 DIAGNOSIS — M79605 Pain in left leg: Secondary | ICD-10-CM | POA: Diagnosis not present

## 2020-10-09 DIAGNOSIS — R202 Paresthesia of skin: Secondary | ICD-10-CM | POA: Diagnosis not present

## 2020-10-09 DIAGNOSIS — M79604 Pain in right leg: Secondary | ICD-10-CM | POA: Diagnosis not present

## 2020-10-09 DIAGNOSIS — M545 Low back pain, unspecified: Secondary | ICD-10-CM | POA: Diagnosis not present

## 2020-10-09 DIAGNOSIS — R2681 Unsteadiness on feet: Secondary | ICD-10-CM | POA: Diagnosis not present

## 2020-10-11 DIAGNOSIS — R000 Tachycardia, unspecified: Secondary | ICD-10-CM | POA: Diagnosis not present

## 2020-10-11 DIAGNOSIS — I7121 Aneurysm of the ascending aorta, without rupture: Secondary | ICD-10-CM | POA: Insufficient documentation

## 2020-10-11 DIAGNOSIS — R002 Palpitations: Secondary | ICD-10-CM | POA: Insufficient documentation

## 2020-10-11 DIAGNOSIS — I10 Essential (primary) hypertension: Secondary | ICD-10-CM | POA: Diagnosis not present

## 2020-10-11 DIAGNOSIS — I712 Thoracic aortic aneurysm, without rupture: Secondary | ICD-10-CM | POA: Diagnosis not present

## 2020-10-18 DIAGNOSIS — R002 Palpitations: Secondary | ICD-10-CM | POA: Diagnosis not present

## 2020-10-18 DIAGNOSIS — I10 Essential (primary) hypertension: Secondary | ICD-10-CM | POA: Diagnosis not present

## 2020-10-18 DIAGNOSIS — I712 Thoracic aortic aneurysm, without rupture: Secondary | ICD-10-CM | POA: Diagnosis not present

## 2020-10-23 DIAGNOSIS — M545 Low back pain, unspecified: Secondary | ICD-10-CM | POA: Diagnosis not present

## 2020-10-23 DIAGNOSIS — M5417 Radiculopathy, lumbosacral region: Secondary | ICD-10-CM | POA: Diagnosis not present

## 2020-10-23 DIAGNOSIS — R200 Anesthesia of skin: Secondary | ICD-10-CM | POA: Diagnosis not present

## 2020-11-08 DIAGNOSIS — K760 Fatty (change of) liver, not elsewhere classified: Secondary | ICD-10-CM | POA: Diagnosis not present

## 2020-11-08 DIAGNOSIS — R110 Nausea: Secondary | ICD-10-CM | POA: Diagnosis not present

## 2020-11-08 DIAGNOSIS — R1013 Epigastric pain: Secondary | ICD-10-CM | POA: Diagnosis not present

## 2020-11-15 DIAGNOSIS — R110 Nausea: Secondary | ICD-10-CM | POA: Diagnosis not present

## 2020-11-26 DIAGNOSIS — E785 Hyperlipidemia, unspecified: Secondary | ICD-10-CM | POA: Diagnosis not present

## 2020-11-26 DIAGNOSIS — E1165 Type 2 diabetes mellitus with hyperglycemia: Secondary | ICD-10-CM | POA: Diagnosis not present

## 2020-11-26 DIAGNOSIS — I10 Essential (primary) hypertension: Secondary | ICD-10-CM | POA: Diagnosis not present

## 2020-11-26 DIAGNOSIS — E039 Hypothyroidism, unspecified: Secondary | ICD-10-CM | POA: Diagnosis not present

## 2020-11-26 DIAGNOSIS — Z0000 Encounter for general adult medical examination without abnormal findings: Secondary | ICD-10-CM | POA: Diagnosis not present

## 2020-12-06 DIAGNOSIS — R29818 Other symptoms and signs involving the nervous system: Secondary | ICD-10-CM | POA: Diagnosis not present

## 2020-12-06 DIAGNOSIS — M545 Low back pain, unspecified: Secondary | ICD-10-CM | POA: Diagnosis not present

## 2020-12-06 DIAGNOSIS — R200 Anesthesia of skin: Secondary | ICD-10-CM | POA: Diagnosis not present

## 2020-12-06 DIAGNOSIS — M5417 Radiculopathy, lumbosacral region: Secondary | ICD-10-CM | POA: Diagnosis not present

## 2020-12-09 DIAGNOSIS — Z1231 Encounter for screening mammogram for malignant neoplasm of breast: Secondary | ICD-10-CM | POA: Diagnosis not present

## 2020-12-12 DIAGNOSIS — R200 Anesthesia of skin: Secondary | ICD-10-CM | POA: Diagnosis not present

## 2020-12-12 DIAGNOSIS — R29818 Other symptoms and signs involving the nervous system: Secondary | ICD-10-CM | POA: Diagnosis not present

## 2020-12-12 DIAGNOSIS — M79604 Pain in right leg: Secondary | ICD-10-CM | POA: Diagnosis not present

## 2020-12-12 DIAGNOSIS — M5417 Radiculopathy, lumbosacral region: Secondary | ICD-10-CM | POA: Diagnosis not present

## 2021-02-14 DIAGNOSIS — M5417 Radiculopathy, lumbosacral region: Secondary | ICD-10-CM | POA: Diagnosis not present

## 2021-02-14 DIAGNOSIS — G603 Idiopathic progressive neuropathy: Secondary | ICD-10-CM | POA: Diagnosis not present

## 2021-02-14 DIAGNOSIS — R29818 Other symptoms and signs involving the nervous system: Secondary | ICD-10-CM | POA: Diagnosis not present

## 2021-08-28 DIAGNOSIS — R29818 Other symptoms and signs involving the nervous system: Secondary | ICD-10-CM | POA: Diagnosis not present

## 2021-08-28 DIAGNOSIS — M545 Low back pain, unspecified: Secondary | ICD-10-CM | POA: Diagnosis not present

## 2021-08-28 DIAGNOSIS — R200 Anesthesia of skin: Secondary | ICD-10-CM | POA: Diagnosis not present

## 2021-08-28 DIAGNOSIS — M5417 Radiculopathy, lumbosacral region: Secondary | ICD-10-CM | POA: Diagnosis not present

## 2021-09-16 DIAGNOSIS — M5116 Intervertebral disc disorders with radiculopathy, lumbar region: Secondary | ICD-10-CM | POA: Diagnosis not present

## 2021-09-17 DIAGNOSIS — E785 Hyperlipidemia, unspecified: Secondary | ICD-10-CM | POA: Diagnosis not present

## 2021-09-17 DIAGNOSIS — I10 Essential (primary) hypertension: Secondary | ICD-10-CM | POA: Diagnosis not present

## 2021-09-17 DIAGNOSIS — E559 Vitamin D deficiency, unspecified: Secondary | ICD-10-CM | POA: Diagnosis not present

## 2021-09-17 DIAGNOSIS — Z23 Encounter for immunization: Secondary | ICD-10-CM | POA: Diagnosis not present

## 2021-09-17 DIAGNOSIS — E039 Hypothyroidism, unspecified: Secondary | ICD-10-CM | POA: Diagnosis not present

## 2021-09-17 DIAGNOSIS — E1165 Type 2 diabetes mellitus with hyperglycemia: Secondary | ICD-10-CM | POA: Diagnosis not present

## 2021-09-17 DIAGNOSIS — Z79899 Other long term (current) drug therapy: Secondary | ICD-10-CM | POA: Diagnosis not present

## 2021-09-17 DIAGNOSIS — G43011 Migraine without aura, intractable, with status migrainosus: Secondary | ICD-10-CM | POA: Diagnosis not present

## 2021-09-22 DIAGNOSIS — M5417 Radiculopathy, lumbosacral region: Secondary | ICD-10-CM | POA: Diagnosis not present

## 2021-09-22 DIAGNOSIS — R29818 Other symptoms and signs involving the nervous system: Secondary | ICD-10-CM | POA: Diagnosis not present

## 2021-09-22 DIAGNOSIS — R200 Anesthesia of skin: Secondary | ICD-10-CM | POA: Diagnosis not present

## 2021-09-22 DIAGNOSIS — M545 Low back pain, unspecified: Secondary | ICD-10-CM | POA: Diagnosis not present

## 2021-10-01 DIAGNOSIS — E785 Hyperlipidemia, unspecified: Secondary | ICD-10-CM | POA: Diagnosis not present

## 2021-10-01 DIAGNOSIS — I10 Essential (primary) hypertension: Secondary | ICD-10-CM | POA: Diagnosis not present

## 2021-10-01 DIAGNOSIS — E039 Hypothyroidism, unspecified: Secondary | ICD-10-CM | POA: Diagnosis not present

## 2021-10-01 DIAGNOSIS — E1165 Type 2 diabetes mellitus with hyperglycemia: Secondary | ICD-10-CM | POA: Diagnosis not present

## 2021-10-05 DIAGNOSIS — Z20822 Contact with and (suspected) exposure to covid-19: Secondary | ICD-10-CM | POA: Diagnosis not present

## 2021-10-05 DIAGNOSIS — R918 Other nonspecific abnormal finding of lung field: Secondary | ICD-10-CM | POA: Diagnosis not present

## 2021-10-05 DIAGNOSIS — R0602 Shortness of breath: Secondary | ICD-10-CM | POA: Diagnosis not present

## 2021-10-05 DIAGNOSIS — G43009 Migraine without aura, not intractable, without status migrainosus: Secondary | ICD-10-CM | POA: Diagnosis not present

## 2021-10-05 DIAGNOSIS — I10 Essential (primary) hypertension: Secondary | ICD-10-CM | POA: Diagnosis not present

## 2021-10-05 DIAGNOSIS — Z289 Immunization not carried out for unspecified reason: Secondary | ICD-10-CM | POA: Diagnosis not present

## 2021-10-05 DIAGNOSIS — E039 Hypothyroidism, unspecified: Secondary | ICD-10-CM | POA: Diagnosis not present

## 2021-10-05 DIAGNOSIS — E119 Type 2 diabetes mellitus without complications: Secondary | ICD-10-CM | POA: Diagnosis not present

## 2021-10-05 DIAGNOSIS — J181 Lobar pneumonia, unspecified organism: Secondary | ICD-10-CM | POA: Diagnosis not present

## 2021-10-05 DIAGNOSIS — Z28310 Unvaccinated for covid-19: Secondary | ICD-10-CM | POA: Diagnosis not present

## 2021-10-05 DIAGNOSIS — R059 Cough, unspecified: Secondary | ICD-10-CM | POA: Diagnosis not present

## 2021-12-09 DIAGNOSIS — Z880 Allergy status to penicillin: Secondary | ICD-10-CM | POA: Diagnosis not present

## 2021-12-09 DIAGNOSIS — Z885 Allergy status to narcotic agent status: Secondary | ICD-10-CM | POA: Diagnosis not present

## 2021-12-09 DIAGNOSIS — I10 Essential (primary) hypertension: Secondary | ICD-10-CM | POA: Diagnosis not present

## 2021-12-09 DIAGNOSIS — R0602 Shortness of breath: Secondary | ICD-10-CM | POA: Diagnosis not present

## 2021-12-09 DIAGNOSIS — E039 Hypothyroidism, unspecified: Secondary | ICD-10-CM | POA: Diagnosis not present

## 2021-12-09 DIAGNOSIS — R0789 Other chest pain: Secondary | ICD-10-CM | POA: Diagnosis not present

## 2021-12-09 DIAGNOSIS — E785 Hyperlipidemia, unspecified: Secondary | ICD-10-CM | POA: Diagnosis not present

## 2021-12-09 DIAGNOSIS — Z87891 Personal history of nicotine dependence: Secondary | ICD-10-CM | POA: Diagnosis not present

## 2021-12-09 DIAGNOSIS — Z882 Allergy status to sulfonamides status: Secondary | ICD-10-CM | POA: Diagnosis not present

## 2021-12-09 DIAGNOSIS — E1165 Type 2 diabetes mellitus with hyperglycemia: Secondary | ICD-10-CM | POA: Diagnosis not present

## 2021-12-09 DIAGNOSIS — Z888 Allergy status to other drugs, medicaments and biological substances status: Secondary | ICD-10-CM | POA: Diagnosis not present

## 2021-12-09 DIAGNOSIS — R0609 Other forms of dyspnea: Secondary | ICD-10-CM | POA: Diagnosis not present

## 2021-12-09 DIAGNOSIS — Z7985 Long-term (current) use of injectable non-insulin antidiabetic drugs: Secondary | ICD-10-CM | POA: Diagnosis not present

## 2021-12-09 DIAGNOSIS — Z7984 Long term (current) use of oral hypoglycemic drugs: Secondary | ICD-10-CM | POA: Diagnosis not present

## 2022-01-09 ENCOUNTER — Ambulatory Visit (INDEPENDENT_AMBULATORY_CARE_PROVIDER_SITE_OTHER): Payer: BC Managed Care – PPO | Admitting: Family Medicine

## 2022-01-09 ENCOUNTER — Encounter: Payer: Self-pay | Admitting: Family Medicine

## 2022-01-09 VITALS — BP 130/89 | HR 81 | Temp 98.2°F | Ht 68.0 in | Wt 225.0 lb

## 2022-01-09 DIAGNOSIS — E559 Vitamin D deficiency, unspecified: Secondary | ICD-10-CM | POA: Diagnosis not present

## 2022-01-09 DIAGNOSIS — I152 Hypertension secondary to endocrine disorders: Secondary | ICD-10-CM

## 2022-01-09 DIAGNOSIS — E1169 Type 2 diabetes mellitus with other specified complication: Secondary | ICD-10-CM | POA: Diagnosis not present

## 2022-01-09 DIAGNOSIS — E1159 Type 2 diabetes mellitus with other circulatory complications: Secondary | ICD-10-CM | POA: Diagnosis not present

## 2022-01-09 DIAGNOSIS — E039 Hypothyroidism, unspecified: Secondary | ICD-10-CM

## 2022-01-09 DIAGNOSIS — E669 Obesity, unspecified: Secondary | ICD-10-CM | POA: Insufficient documentation

## 2022-01-09 DIAGNOSIS — E119 Type 2 diabetes mellitus without complications: Secondary | ICD-10-CM | POA: Insufficient documentation

## 2022-01-09 DIAGNOSIS — I7121 Aneurysm of the ascending aorta, without rupture: Secondary | ICD-10-CM

## 2022-01-09 DIAGNOSIS — E785 Hyperlipidemia, unspecified: Secondary | ICD-10-CM | POA: Diagnosis not present

## 2022-01-09 LAB — BAYER DCA HB A1C WAIVED: HB A1C (BAYER DCA - WAIVED): 10.8 % — ABNORMAL HIGH (ref 4.8–5.6)

## 2022-01-09 MED ORDER — TRULICITY 1.5 MG/0.5ML ~~LOC~~ SOAJ: 1.5000 mg | SUBCUTANEOUS | 0 refills | Status: AC

## 2022-01-09 MED ORDER — METOPROLOL SUCCINATE ER 50 MG PO TB24: 50.0000 mg | ORAL_TABLET | Freq: Every day | ORAL | 3 refills | Status: DC

## 2022-01-09 MED ORDER — LOSARTAN POTASSIUM 50 MG PO TABS: 50.0000 mg | ORAL_TABLET | Freq: Every day | ORAL | 3 refills | Status: DC

## 2022-01-09 NOTE — Progress Notes (Signed)
Subjective:  Patient ID: Jenny Willis, female    DOB: 12-Aug-1966, 56 y.o.   MRN: [id number]  Patient Care Team: Baruch Gouty, FNP as PCP - General (Family Medicine)   Chief Complaint:  New Patient (Initial Visit) (Diabetic management/Shortness of breath)   HPI: Jenny Willis is a 56 y.o. female presenting on 01/09/2022 for New Patient (Initial Visit) (Diabetic management/Shortness of breath)   Patient presents today to establish care with new PCP.  She was formally followed by Dr. Tasia Catchings with [facility] and was last seen 09/17/2021.  She has a very complex medical history including uncontrolled type 2 diabetes with associated hypertension, hyperlipidemia, and obesity.  She has a history of an ascending aortic aneurysm which was formally followed by [facility].  She has migraine headaches and chronic low back pain for which she sees neurology.  She does have obstructive sleep apnea and uses a CPAP.  She has acquired hypothyroidism and vitamin D deficiency.  Upon review of EHR, it is noted that patient has a history of medication/treatment regimen noncompliance.  She states the main reason she is here today is because she does not want to take metformin and her prior provider did not agree with this.  Last A1C greater than 12.  She was prescribed Lantus but never started taking this.  She has not been taking metformin as prescribed, states she is only taking Trulicity 1.5 mg weekly.  She has had prior cardiac work-up including a stress test which was unremarkable.  She denies any chest pain, palpitations, dizziness, syncope, weakness, fatigue, visual changes, polyuria, polyphagia, polydipsia, confusion, heat or cold intolerance, diarrhea or constipation, leg swelling, or skin or nail changes. She has been referred to endocrinology in the past but has not seen them.  Patient did arrive late to appointment today and this was discussed with her.    Relevant past medical, surgical, family, and  social history reviewed and updated as indicated.  Allergies and medications reviewed and updated. Data reviewed: Chart in Epic.   Past Medical History:  Diagnosis Date   Diabetes mellitus without complication ([place])    Hyperlipidemia    Hypertension    Thyroid disease     Past Surgical History:  Procedure Laterality Date   ABDOMINAL HYSTERECTOMY     CYSTECTOMY Left     Social History   Socioeconomic History   Marital status: Married    Spouse name: Not on file   Number of children: Not on file   Years of education: Not on file   Highest education level: Not on file  Occupational History   Not on file  Tobacco Use   Smoking status: Former   Smokeless tobacco: Never  Substance and Sexual Activity   Alcohol use: No   Drug use: No   Sexual activity: Yes  Other Topics Concern   Not on file  Social History Narrative   Not on file   Social Determinants of Health   Financial Resource Strain: Not on file  Food Insecurity: Not on file  Transportation Needs: Not on file  Physical Activity: Not on file  Stress: Not on file  Social Connections: Not on file  Intimate Partner Violence: Not on file    Outpatient Encounter Medications as of 01/09/2022  Medication Sig   amLODipine (NORVASC) 2.5 MG tablet Take by mouth.   baclofen (LIORESAL) 10 MG tablet Take 1 tablet by mouth 3 (three) times daily as needed.   cyclobenzaprine (FLEXERIL) 10  MG tablet Take by mouth.   hydrochlorothiazide (MICROZIDE) 12.5 MG capsule Take by mouth.   levothyroxine (SYNTHROID) 50 MCG tablet Take by mouth.   metFORMIN (GLUCOPHAGE-XR) 500 MG 24 hr tablet Take by mouth.   rosuvastatin (CRESTOR) 20 MG tablet Take by mouth.   [DISCONTINUED] Dulaglutide (TRULICITY) 1.5 SW/1.0XN SOPN INJECT 1 DOSE (1.5 MG TOTAL) SUBCUTANEOUSLY ONCE A WEEK AT 9   [DISCONTINUED] losartan (COZAAR) 50 MG tablet Take by mouth.   aspirin 81 MG chewable tablet Chew by mouth.   butalbital-acetaminophen-caffeine (FIORICET)  50-325-40 MG tablet Take 1 tablet by mouth 2 (two) times daily.   cetirizine (ZYRTEC) 10 MG tablet Take by mouth.   Dulaglutide (TRULICITY) 1.5 AT/5.5DD SOPN Inject 1.5 mg into the skin once a week.   fluticasone (FLONASE) 50 MCG/ACT nasal spray Place two sprays in each nostril once daily   losartan (COZAAR) 50 MG tablet Take 1 tablet (50 mg total) by mouth daily.   MELATONIN PO Take by mouth.   metoprolol succinate (TOPROL-XL) 50 MG 24 hr tablet Take 1 tablet (50 mg total) by mouth daily.   NON FORMULARY OmegaCo3   NON FORMULARY Liqua-D (K-87)   SUMAtriptan (IMITREX) 100 MG tablet Take by mouth.   [DISCONTINUED] benzonatate (TESSALON) 200 MG capsule Take 1 capsule (200 mg total) by mouth at bedtime. Take as needed for cough   [DISCONTINUED] doxycycline (VIBRAMYCIN) 100 MG capsule Take 1 capsule (100 mg total) by mouth 2 (two) times daily. (Rx void after 10/15/13)   [DISCONTINUED] metoprolol succinate (TOPROL-XL) 50 MG 24 hr tablet Take 50 mg by mouth daily.   [DISCONTINUED] predniSONE (DELTASONE) 20 MG tablet Take 1 tablet (20 mg total) by mouth 2 (two) times daily. Take with food.   No facility-administered encounter medications on file as of 01/09/2022.    Allergies  Allergen Reactions   Gabapentin Shortness Of Breath   Nitrofurantoin Macrocrystal Other (See Comments)    Muscle spasms    Sulfa Antibiotics Hives   Codeine    Metformin     REACTION: diarrhea   Penicillins     REACTION: childhood reaction wtih Scarlett Fever   Sulfonamide Derivatives     REACTION: GI upset   Lisinopril Other (See Comments)    Review of Systems      Objective:  BP 130/89   Pulse 81   Temp 98.2 F (36.8 C)   Ht 5' 8"  (1.727 m)   Wt 225 lb (102.1 kg)   SpO2 96%   BMI 34.21 kg/m    Wt Readings from Last 3 Encounters:  01/09/22 225 lb (102.1 kg)  10/07/13 243 lb (110.2 kg)    Physical Exam Vitals and nursing note reviewed.  Constitutional:      General: She is not in acute distress.     Appearance: Normal appearance. She is well-developed and well-groomed. She is obese. She is not ill-appearing, toxic-appearing or diaphoretic.  HENT:     Head: Normocephalic and atraumatic.     Jaw: There is normal jaw occlusion.     Right Ear: Hearing normal.     Left Ear: Hearing normal.     Nose: Nose normal.     Mouth/Throat:     Lips: Pink.     Mouth: Mucous membranes are moist.     Pharynx: Oropharynx is clear. Uvula midline.  Eyes:     General: Lids are normal.     Extraocular Movements: Extraocular movements intact.     Conjunctiva/sclera: Conjunctivae normal.  Pupils: Pupils are equal, round, and reactive to light.  Neck:     Thyroid: No thyroid mass, thyromegaly or thyroid tenderness.     Vascular: No carotid bruit or JVD.     Trachea: Trachea and phonation normal.  Cardiovascular:     Rate and Rhythm: Normal rate and regular rhythm.     Chest Wall: PMI is not displaced.     Pulses: Normal pulses.     Heart sounds: Normal heart sounds. No murmur heard.   No friction rub. No gallop.  Pulmonary:     Effort: Pulmonary effort is normal. No respiratory distress.     Breath sounds: Normal breath sounds. No wheezing.  Abdominal:     General: Bowel sounds are normal. There is no abdominal bruit.     Palpations: Abdomen is soft. There is no hepatomegaly or splenomegaly.  Musculoskeletal:        General: Normal range of motion.     Cervical back: Normal range of motion and neck supple.     Right lower leg: No edema.     Left lower leg: No edema.  Lymphadenopathy:     Cervical: No cervical adenopathy.  Skin:    General: Skin is warm and dry.     Capillary Refill: Capillary refill takes less than 2 seconds.     Coloration: Skin is not cyanotic, jaundiced or pale.     Findings: No rash.  Neurological:     General: No focal deficit present.     Mental Status: She is alert and oriented to person, place, and time.     Sensory: Sensation is intact.     Motor: Motor  function is intact.     Coordination: Coordination is intact.     Gait: Gait is intact.     Deep Tendon Reflexes: Reflexes are normal and symmetric.  Psychiatric:        Attention and Perception: Attention and perception normal.        Mood and Affect: Mood and affect normal.        Speech: Speech normal.        Behavior: Behavior normal. Behavior is cooperative.        Thought Content: Thought content normal.        Cognition and Memory: Cognition and memory normal.        Judgment: Judgment normal.    Results for orders placed or performed in visit on 10/11/07  Converted [facility] Lab  Result Value Ref Range   Thyroperoxidase Ab SerPl-aCnc 3455.3 (H) 0.0 - 60.0   Thyroglobulin Ab 69.2 U/mL (H) 0.0 - 60.0       Pertinent labs & imaging results that were available during my care of the patient were reviewed by me and considered in my medical decision making.  Assessment & Plan:  Haneef[place] was seen today for new patient (initial visit).  Diagnoses and all orders for this visit:  Type 2 diabetes mellitus with other specified complication, without long-term current use of insulin ([place]) We will obtain baseline labs today.  Diet and exercise encouraged.  Further treatment pending results.  Patient has been out of Trulicity needs refill, will send today. -     Bayer DCA Hb A1c Waived -     CBC with Differential/Platelet -     CMP14+EGFR -     Lipid panel -     Thyroid Panel With TSH -     VITAMIN D 25 Hydroxy (Vit-D Deficiency, Fractures) -  Microalbumin / creatinine urine ratio -     Dulaglutide (TRULICITY) 1.5 YE/2.3VK SOPN; Inject 1.5 mg into the skin once a week. -     Ambulatory referral to Cardiology  Hypertension associated with diabetes ([place]) Blood pressure well controlled today.  Will obtain baseline labs.  Patient does need refill on Toprol and Cozaar, will send today.  Referral to new cardiology placed. -     CBC with Differential/Platelet -     CMP14+EGFR -     Lipid  panel -     Thyroid Panel With TSH -     Microalbumin / creatinine urine ratio -     metoprolol succinate (TOPROL-XL) 50 MG 24 hr tablet; Take 1 tablet (50 mg total) by mouth daily. -     losartan (COZAAR) 50 MG tablet; Take 1 tablet (50 mg total) by mouth daily. -     Ambulatory referral to Cardiology  Hyperlipidemia associated with type 2 diabetes mellitus ([place]) On statin therapy.  Labs pending.  Diet and exercise encouraged. -     CMP14+EGFR -     Lipid panel -     Ambulatory referral to Cardiology  Acquired hypothyroidism We will check labs today and adjust repletion therapy if warranted. -     Thyroid Panel With TSH  Vitamin D deficiency Labs pending. Continue repletion therapy. If indicated, will change repletion dosage. Eat foods rich in Vit D including milk, orange juice, yogurt with vitamin D added, salmon or mackerel, canned tuna fish, cereals with vitamin D added, and cod liver oil. Get out in the sun but make sure to wear at least SPF 30 sunscreen.  -     VITAMIN D 25 Hydroxy (Vit-D Deficiency, Fractures)  Aneurysm of ascending aorta without rupture [facility]) Referral placed to establish with new cardiology. -     Ambulatory referral to Cardiology  Obesity (BMI 30.0-34.9) Diet and exercise encouraged.  Labs pending. -     Bayer DCA Hb A1c Waived -     CBC with Differential/Platelet -     CMP14+EGFR -     Lipid panel -     Thyroid Panel With TSH     Continue all other maintenance medications.  Follow up plan: Return in about 4 weeks (around 02/06/2022), or if symptoms worsen or fail to improve, for DM.   Continue healthy lifestyle choices, including diet (rich in fruits, vegetables, and lean proteins, and low in salt and simple carbohydrates) and exercise (at least 30 minutes of moderate physical activity daily).  Educational handout given for DM  The above assessment and management plan was discussed with the patient. The patient verbalized understanding of and has  agreed to the management plan. Patient is aware to call the clinic if they develop any new symptoms or if symptoms persist or worsen. Patient is aware when to return to the clinic for a follow-up visit. Patient educated on when it is appropriate to go to the emergency department.   Monia Pouch, FNP-C [facility] [phone]

## 2022-01-09 NOTE — Patient Instructions (Signed)

## 2022-01-10 LAB — CMP14+EGFR
ALT: 19 IU/L (ref 0–32)
AST: 12 IU/L (ref 0–40)
Albumin/Globulin Ratio: 1.5 (ref 1.2–2.2)
Albumin: 4.5 g/dL (ref 3.8–4.9)
Alkaline Phosphatase: 73 IU/L (ref 44–121)
BUN/Creatinine Ratio: 21 (ref 9–23)
BUN: 13 mg/dL (ref 6–24)
Bilirubin Total: 0.3 mg/dL (ref 0.0–1.2)
CO2: 23 mmol/L (ref 20–29)
Calcium: 9.7 mg/dL (ref 8.7–10.2)
Chloride: 101 mmol/L (ref 96–106)
Creatinine, Ser: 0.62 mg/dL (ref 0.57–1.00)
Globulin, Total: 3.1 g/dL (ref 1.5–4.5)
Glucose: 196 mg/dL — ABNORMAL HIGH (ref 70–99)
Potassium: 4.3 mmol/L (ref 3.5–5.2)
Sodium: 140 mmol/L (ref 134–144)
Total Protein: 7.6 g/dL (ref 6.0–8.5)
eGFR: 103 mL/min/{1.73_m2} (ref >59)

## 2022-01-10 LAB — THYROID PANEL WITH TSH
Free Thyroxine Index: 2 (ref 1.2–4.9)
T3 Uptake Ratio: 24 % (ref 24–39)
T4, Total: 8.5 ug/dL (ref 4.5–12.0)
TSH: 3.46 u[IU]/mL (ref 0.450–4.500)

## 2022-01-10 LAB — LIPID PANEL
Chol/HDL Ratio: 2.7 ratio (ref 0.0–4.4)
Cholesterol, Total: 121 mg/dL (ref 100–199)
HDL: 45 mg/dL (ref >39)
LDL Chol Calc (NIH): 53 mg/dL (ref 0–99)
Triglycerides: 132 mg/dL (ref 0–149)
VLDL Cholesterol Cal: 23 mg/dL (ref 5–40)

## 2022-01-10 LAB — CBC WITH DIFFERENTIAL/PLATELET
Basophils Absolute: 0.1 10*3/uL (ref 0.0–0.2)
Basos: 1 %
EOS (ABSOLUTE): 0.1 10*3/uL (ref 0.0–0.4)
Eos: 2 %
Hematocrit: 42.4 % (ref 34.0–46.6)
Hemoglobin: 14.2 g/dL (ref 11.1–15.9)
Immature Grans (Abs): 0 10*3/uL (ref 0.0–0.1)
Immature Granulocytes: 1 %
Lymphocytes Absolute: 3 10*3/uL (ref 0.7–3.1)
Lymphs: 34 %
MCH: 28.5 pg (ref 26.6–33.0)
MCHC: 33.5 g/dL (ref 31.5–35.7)
MCV: 85 fL (ref 79–97)
Monocytes Absolute: 0.6 10*3/uL (ref 0.1–0.9)
Monocytes: 7 %
Neutrophils Absolute: 5 10*3/uL (ref 1.4–7.0)
Neutrophils: 55 %
Platelets: 282 10*3/uL (ref 150–450)
RBC: 4.98 x10E6/uL (ref 3.77–5.28)
RDW: 12 % (ref 11.7–15.4)
WBC: 8.8 10*3/uL (ref 3.4–10.8)

## 2022-01-10 LAB — VITAMIN D 25 HYDROXY (VIT D DEFICIENCY, FRACTURES): Vit D, 25-Hydroxy: 39.1 ng/mL (ref 30.0–100.0)

## 2022-01-12 LAB — MICROALBUMIN / CREATININE URINE RATIO
Creatinine, Urine: 209.2 mg/dL
Microalb/Creat Ratio: 12 mg/g creat (ref 0–29)
Microalbumin, Urine: 25 ug/mL

## 2022-01-13 MED ORDER — SITAGLIPTIN PHOSPHATE 25 MG PO TABS: 25.0000 mg | ORAL_TABLET | Freq: Every day | ORAL | 5 refills | Status: DC

## 2022-01-13 NOTE — Addendum Note (Signed)
Addended by: Sonny Masters on: 01/13/2022 08:09 AM   Modules accepted: Orders

## 2022-02-10 ENCOUNTER — Encounter: Payer: Self-pay | Admitting: Family Medicine

## 2022-02-10 ENCOUNTER — Ambulatory Visit: Payer: BC Managed Care – PPO | Admitting: Family Medicine

## 2022-02-10 VITALS — BP 130/89 | HR 81 | Temp 98.7°F | Ht 68.0 in | Wt 227.0 lb

## 2022-02-10 DIAGNOSIS — E1169 Type 2 diabetes mellitus with other specified complication: Secondary | ICD-10-CM

## 2022-02-10 DIAGNOSIS — R0602 Shortness of breath: Secondary | ICD-10-CM

## 2022-02-10 LAB — GLUCOSE HEMOCUE WAIVED: Glu Hemocue Waived: 310 mg/dL — ABNORMAL HIGH (ref 70–99)

## 2022-02-10 MED ORDER — ALBUTEROL SULFATE HFA 108 (90 BASE) MCG/ACT IN AERS: 2.0000 | INHALATION_SPRAY | Freq: Four times a day (QID) | RESPIRATORY_TRACT | 2 refills | Status: DC | PRN

## 2022-02-24 ENCOUNTER — Other Ambulatory Visit: Payer: Self-pay | Admitting: Family Medicine

## 2022-02-24 DIAGNOSIS — Z1231 Encounter for screening mammogram for malignant neoplasm of breast: Secondary | ICD-10-CM

## 2022-03-09 ENCOUNTER — Inpatient Hospital Stay: Admission: RE | Admit: 2022-03-09 | Payer: Self-pay | Source: Ambulatory Visit

## 2022-03-23 ENCOUNTER — Ambulatory Visit
Admission: RE | Admit: 2022-03-23 | Discharge: 2022-03-23 | Disposition: A | Discharge status: Home / Self Care | Payer: Self-pay | Source: Ambulatory Visit | Attending: Family Medicine | Admitting: Family Medicine

## 2022-03-23 DIAGNOSIS — Z1231 Encounter for screening mammogram for malignant neoplasm of breast: Secondary | ICD-10-CM

## 2022-03-27 ENCOUNTER — Ambulatory Visit: Payer: BC Managed Care – PPO | Admitting: Family

## 2022-03-27 ENCOUNTER — Encounter: Payer: Self-pay | Admitting: Family

## 2022-03-27 VITALS — BP 135/95 | HR 80 | Temp 97.9°F | Ht 68.0 in | Wt 228.2 lb

## 2022-03-27 DIAGNOSIS — M7582 Other shoulder lesions, left shoulder: Secondary | ICD-10-CM | POA: Diagnosis not present

## 2022-03-27 DIAGNOSIS — M25532 Pain in left wrist: Secondary | ICD-10-CM | POA: Diagnosis not present

## 2022-03-27 MED ORDER — KETOROLAC TROMETHAMINE 60 MG/2ML IM SOLN
60.0000 mg | Freq: Once | INTRAMUSCULAR | Status: AC
  Administered 2022-03-27: 60 mg via INTRAMUSCULAR

## 2022-03-27 MED ORDER — METHYLPREDNISOLONE ACETATE 80 MG/ML IJ SUSP
80.0000 mg | Freq: Once | INTRAMUSCULAR | Status: AC
  Administered 2022-03-27: 80 mg via INTRAMUSCULAR

## 2022-03-27 MED ORDER — CYCLOBENZAPRINE HCL 10 MG PO TABS: 10.0000 mg | ORAL_TABLET | Freq: Three times a day (TID) | ORAL | 1 refills | Status: DC | PRN

## 2022-03-27 MED ORDER — DICLOFENAC SODIUM 75 MG PO TBEC: 75.0000 mg | DELAYED_RELEASE_TABLET | Freq: Two times a day (BID) | ORAL | 2 refills | Status: DC

## 2022-03-27 NOTE — Progress Notes (Signed)
Subjective:    Patient ID: Jenny Willis, female    DOB: 11/13/1965, 56 y.o.   MRN: [id number]  Chief Complaint  Patient presents with   Arm Pain    Left pain an and off a couple of weeks. Patient states pain is at 7 right noe    PT presents to the office today with left hand that radiates to her shoulder that started three weeks. However, it has worsen. She is retired and denies any repetitive motions.  Arm Pain   Wrist Pain  The pain is present in the left wrist and left arm. This is a new problem. The current episode started 1 to 4 weeks ago. There has been no history of extremity trauma. The problem occurs constantly. The quality of the pain is described as aching. The pain is at a severity of 9/10. The pain is moderate. Associated symptoms include joint swelling. She has tried rest (flexeril) for the symptoms. The treatment provided mild relief.      Review of Systems  All other systems reviewed and are negative.      Objective:   Physical Exam Vitals reviewed.  Constitutional:      General: She is not in acute distress.    Appearance: She is well-developed.  HENT:     Head: Normocephalic and atraumatic.  Eyes:     Pupils: Pupils are equal, round, and reactive to light.  Neck:     Thyroid: No thyromegaly.  Cardiovascular:     Rate and Rhythm: Normal rate and regular rhythm.     Heart sounds: Normal heart sounds. No murmur heard. Pulmonary:     Effort: Pulmonary effort is normal. No respiratory distress.     Breath sounds: Normal breath sounds. No wheezing.  Abdominal:     General: Bowel sounds are normal. There is no distension.     Palpations: Abdomen is soft.     Tenderness: There is no abdominal tenderness.  Musculoskeletal:        General: Tenderness present. Normal range of motion.     Cervical back: Normal range of motion and neck supple.     Comments: Pain in left wrist with flexion, left shoulder pain with flexion and extension  Skin:    General:  Skin is warm and dry.  Neurological:     Mental Status: She is alert and oriented to person, place, and time.     Cranial Nerves: No cranial nerve deficit.     Deep Tendon Reflexes: Reflexes are normal and symmetric.  Psychiatric:        Behavior: Behavior normal.        Thought Content: Thought content normal.        Judgment: Judgment normal.     BP (!) 171/106   Pulse 80   Temp 97.9 F (36.6 C) (Temporal)   Ht 5\' 8"  (1.727 m)   Wt 228 lb 3.2 oz (103.5 kg)   SpO2 98%   BMI 34.70 kg/m        Assessment & Plan:   Jenny Willis comes in today with chief complaint of Arm Pain (Left pain an and off a couple of weeks. Patient states pain is at 7 right noe )   Diagnosis and orders addressed:  1. Tendinitis of left rotator cuff - methylPREDNISolone acetate (DEPO-MEDROL) injection 80 mg - ketorolac (TORADOL) injection 60 mg - cyclobenzaprine (FLEXERIL) 10 MG tablet; Take 1 tablet (10 mg total) by mouth 3 (three) times daily as  needed for muscle spasms.  Dispense: 90 tablet; Refill: 1 - diclofenac (VOLTAREN) 75 MG EC tablet; Take 1 tablet (75 mg total) by mouth 2 (two) times daily.  Dispense: 60 tablet; Refill: 2  2. Left wrist pain - methylPREDNISolone acetate (DEPO-MEDROL) injection 80 mg - ketorolac (TORADOL) injection 60 mg - cyclobenzaprine (FLEXERIL) 10 MG tablet; Take 1 tablet (10 mg total) by mouth 3 (three) times daily as needed for muscle spasms.  Dispense: 90 tablet; Refill: 1 - diclofenac (VOLTAREN) 75 MG EC tablet; Take 1 tablet (75 mg total) by mouth 2 (two) times daily.  Dispense: 60 tablet; Refill: 2   Will give diclofenac BID with food, no other NSAID's Rest Ice Flexeril as needed Follow up if symptoms  worsen or do not improve    Jannifer Rodney, FNP

## 2022-03-27 NOTE — Patient Instructions (Signed)
Rotator Cuff Tendinitis  Rotator cuff tendinitis is inflammation of the tendons in the rotator cuff. Tendons are tough, cord-like bands that connect muscle to bone. The rotator cuff includes all of the muscles and tendons that connect the arm to the shoulder. The rotator cuff holds the head of the humerus, or the upper arm bone, in the cup of the shoulder blade (scapula). This condition can lead to a long-term or chronic tear. The tear may be partial or complete. What are the causes? This condition is usually caused by overusing the rotator cuff. What increases the risk? This condition is more likely to develop in athletes and workers who frequently use their shoulder or reach over their heads. This can include activities such as: Tennis. Baseball or softball. Swimming. Construction work. Painting. What are the signs or symptoms? Symptoms of this condition include: Pain that spreads (radiates) from the shoulder to the upper arm. Swelling and tenderness in front of the shoulder. Pain when reaching, pulling, or lifting the arm above the head. Pain when lowering the arm from above the head. Minor pain in the shoulder when resting. Increased pain in the shoulder at night. Difficulty placing the arm behind the back. How is this diagnosed? This condition is diagnosed with a physical exam and medical history. Tests may also be done, including: X-rays. MRI. Ultrasound. CT with or without contrast. How is this treated? Treatment for this condition depends on the severity of the condition. In less severe cases, treatment may include: Rest. This may be done with a sling that holds the shoulder still (immobilization). Your health care provider may also recommend avoiding activities that involve lifting your arm over your head. Icing the shoulder. Anti-inflammatory medicines, such as aspirin or ibuprofen. In more severe cases, treatment may include: Physical therapy. Steroid  injections. Surgery. Follow these instructions at home: If you have a sling: Wear the sling as told by your health care provider. Remove it only as told by your health care provider. Loosen it if your fingers tingle, become numb, or turn cold and blue. Keep it clean. If the sling is not waterproof: Do not let it get wet. Cover it with a watertight covering when you take a bath or shower. Managing pain, stiffness, and swelling  If directed, put ice on the injured area. To do this: If you have a removable sling, remove it as told by your health care provider. Put ice in a plastic bag. Place a towel between your skin and the bag. Leave the ice on for 20 minutes, 2-3 times a day. Move your fingers often to reduce stiffness and swelling. Raise (elevate) the injured area above the level of your heart while you are lying down. Find a comfortable sleeping position, or sleep in a recliner, if available. Activity Rest your shoulder as told by your health care provider. Ask your health care provider when it is safe to drive if you have a sling on your arm. Return to your normal activities as told by your health care provider. Ask your health care provider what activities are safe for you. Do any exercises or stretches as told by your health care provider or physical therapist. If you do repetitive overhead tasks, take small breaks in between and include stretching exercises as told by your health care provider. General instructions Do not use any products that contain nicotine or tobacco, such as cigarettes, e-cigarettes, and chewing tobacco. These can delay healing. If you need help quitting, ask your health care provider.   Take over-the-counter and prescription medicines only as told by your health care provider. Keep all follow-up visits as told by your health care provider. This is important. Contact a health care provider if: Your pain gets worse. You have new pain in your arm, hands, or  fingers. Your pain is not relieved with medicine or does not get better after 6 weeks of treatment. You have crackling sensations when moving your shoulder in certain directions. You hear a snapping sound after using your shoulder, followed by severe pain and weakness. Get help right away if: Your arm, hand, or fingers are numb or tingling. Your arm, hand, or fingers are swollen or painful or they turn white or blue. Summary Rotator cuff tendinitis is inflammation of the tendons in the rotator cuff. Tendons are tough, cord-like bands that connect muscle to bone. This condition is usually caused by overusing the rotator cuff, which includes all of the muscles and tendons that connect the arm to the shoulder. This condition is more likely to develop in athletes and workers who frequently use their shoulder or reach over their heads. Treatment generally includes rest, anti-inflammatory medicines, and icing. In some cases, physical therapy and steroid injections may be needed. In severe cases, surgery may be needed. This information is not intended to replace advice given to you by your health care provider. Make sure you discuss any questions you have with your health care provider. Document Revised: 05/08/2019 Document Reviewed: 05/08/2019 Elsevier Patient Education  2023 [employer]  

## 2022-03-29 ENCOUNTER — Other Ambulatory Visit: Payer: Self-pay | Admitting: Family Medicine

## 2022-03-29 DIAGNOSIS — Z79890 Hormone replacement therapy (postmenopausal): Secondary | ICD-10-CM | POA: Diagnosis not present

## 2022-03-29 DIAGNOSIS — M542 Cervicalgia: Secondary | ICD-10-CM | POA: Diagnosis not present

## 2022-03-29 DIAGNOSIS — Z888 Allergy status to other drugs, medicaments and biological substances status: Secondary | ICD-10-CM | POA: Diagnosis not present

## 2022-03-29 DIAGNOSIS — E079 Disorder of thyroid, unspecified: Secondary | ICD-10-CM | POA: Diagnosis not present

## 2022-03-29 DIAGNOSIS — Z7982 Long term (current) use of aspirin: Secondary | ICD-10-CM | POA: Diagnosis not present

## 2022-03-29 DIAGNOSIS — M25512 Pain in left shoulder: Secondary | ICD-10-CM | POA: Diagnosis not present

## 2022-03-29 DIAGNOSIS — S46812A Strain of other muscles, fascia and tendons at shoulder and upper arm level, left arm, initial encounter: Secondary | ICD-10-CM | POA: Diagnosis not present

## 2022-03-29 DIAGNOSIS — E119 Type 2 diabetes mellitus without complications: Secondary | ICD-10-CM | POA: Diagnosis not present

## 2022-03-29 DIAGNOSIS — E785 Hyperlipidemia, unspecified: Secondary | ICD-10-CM | POA: Diagnosis not present

## 2022-03-29 DIAGNOSIS — Z7984 Long term (current) use of oral hypoglycemic drugs: Secondary | ICD-10-CM | POA: Diagnosis not present

## 2022-03-29 DIAGNOSIS — Z79899 Other long term (current) drug therapy: Secondary | ICD-10-CM | POA: Diagnosis not present

## 2022-03-29 DIAGNOSIS — Z882 Allergy status to sulfonamides status: Secondary | ICD-10-CM | POA: Diagnosis not present

## 2022-03-29 DIAGNOSIS — E1159 Type 2 diabetes mellitus with other circulatory complications: Secondary | ICD-10-CM

## 2022-03-29 DIAGNOSIS — Z87891 Personal history of nicotine dependence: Secondary | ICD-10-CM | POA: Diagnosis not present

## 2022-03-29 DIAGNOSIS — Z881 Allergy status to other antibiotic agents status: Secondary | ICD-10-CM | POA: Diagnosis not present

## 2022-03-29 DIAGNOSIS — Z880 Allergy status to penicillin: Secondary | ICD-10-CM | POA: Diagnosis not present

## 2022-03-29 DIAGNOSIS — I10 Essential (primary) hypertension: Secondary | ICD-10-CM | POA: Diagnosis not present

## 2022-03-29 DIAGNOSIS — Z7985 Long-term (current) use of injectable non-insulin antidiabetic drugs: Secondary | ICD-10-CM | POA: Diagnosis not present

## 2022-03-29 DIAGNOSIS — Z885 Allergy status to narcotic agent status: Secondary | ICD-10-CM | POA: Diagnosis not present

## 2022-03-29 DIAGNOSIS — M7592 Shoulder lesion, unspecified, left shoulder: Secondary | ICD-10-CM | POA: Diagnosis not present

## 2022-04-01 DIAGNOSIS — M792 Neuralgia and neuritis, unspecified: Secondary | ICD-10-CM | POA: Diagnosis not present

## 2022-04-09 ENCOUNTER — Telehealth (INDEPENDENT_AMBULATORY_CARE_PROVIDER_SITE_OTHER): Payer: BC Managed Care – PPO | Admitting: Family Medicine

## 2022-04-09 DIAGNOSIS — Z91199 Patient's noncompliance with other medical treatment and regimen due to unspecified reason: Secondary | ICD-10-CM

## 2022-04-09 NOTE — Progress Notes (Signed)
Joined video visit and sent three notifications to pt. Waited over 20 minutes, pt never joined visit. No show.

## 2022-04-13 NOTE — Progress Notes (Deleted)
Cardiology Office Note   Date:  04/13/2022   ID:  Jaymee, Tilson [birth year], MRN [id number]  PCP:  Sonny Masters, FNP  Cardiologist:   None Referring:  ***  No chief complaint on file.     History of Present Illness: Jenny Willis is a 56 y.o. female who presents for ***    She was seen in [place] for palpitations.  However, I do not see previous tests as studies   I reviewed these records for this visit.      Past Medical History:  Diagnosis Date   Diabetes mellitus without complication (HCC)    Hyperlipidemia    Hypertension    Thyroid disease     Past Surgical History:  Procedure Laterality Date   ABDOMINAL HYSTERECTOMY     BREAST BIOPSY Left    CYSTECTOMY Left      Current Outpatient Medications  Medication Sig Dispense Refill   albuterol (VENTOLIN HFA) 108 (90 Base) MCG/ACT inhaler Inhale 2 puffs into the lungs every 6 (six) hours as needed for wheezing or shortness of breath. 8 g 2   amLODipine (NORVASC) 2.5 MG tablet Take by mouth.     aspirin 81 MG chewable tablet Chew by mouth.     butalbital-acetaminophen-caffeine (FIORICET) 50-325-40 MG tablet Take 1 tablet by mouth 2 (two) times daily.     cetirizine (ZYRTEC) 10 MG tablet Take by mouth.     cyclobenzaprine (FLEXERIL) 10 MG tablet Take 1 tablet (10 mg total) by mouth 3 (three) times daily as needed for muscle spasms. 90 tablet 1   diclofenac (VOLTAREN) 75 MG EC tablet Take 1 tablet (75 mg total) by mouth 2 (two) times daily. 60 tablet 2   fluticasone (FLONASE) 50 MCG/ACT nasal spray Place two sprays in each nostril once daily 16 g 1   hydrochlorothiazide (MICROZIDE) 12.5 MG capsule Take by mouth.     levothyroxine (SYNTHROID) 50 MCG tablet Take by mouth.     losartan (COZAAR) 50 MG tablet TAKE 1 TABLET BY MOUTH EVERY DAY 90 tablet 0   MELATONIN PO Take by mouth.     metoprolol succinate (TOPROL-XL) 50 MG 24 hr tablet Take 1 tablet (50 mg total) by mouth daily. 30 tablet 3   NON FORMULARY  OmegaCo3     NON FORMULARY Liqua-D (K-87)     rosuvastatin (CRESTOR) 20 MG tablet Take by mouth.     sitaGLIPtin (JANUVIA) 25 MG tablet Take 1 tablet (25 mg total) by mouth daily. 30 tablet 5   SUMAtriptan (IMITREX) 100 MG tablet Take by mouth.     No current facility-administered medications for this visit.    Allergies:   Gabapentin, Nitrofurantoin macrocrystal, Sulfa antibiotics, Codeine, Metformin, Penicillins, Sulfonamide derivatives, and Lisinopril    Social History:  The patient  reports that she has quit smoking. She has never used smokeless tobacco. She reports that she does not drink alcohol and does not use drugs.   Family History:  The patient's ***family history includes Breast cancer in her cousin, cousin, cousin, and cousin; COPD in her father and mother; Cancer in her father, mother, and another family member; Diabetes in an other family member; Hyperlipidemia in an other family member; Hypertension in an other family member.    ROS:  Please see the history of present illness.   Otherwise, review of systems are positive for {NONE DEFAULTED:18576}.   All other systems are reviewed and negative.    PHYSICAL EXAM: VS:  There were no vitals taken for this visit. , BMI There is no height or weight on file to calculate BMI. GENERAL:  Well appearing HEENT:  Pupils equal round and reactive, fundi not visualized, oral mucosa unremarkable NECK:  No jugular venous distention, waveform within normal limits, carotid upstroke brisk and symmetric, no bruits, no thyromegaly LYMPHATICS:  No cervical, inguinal adenopathy LUNGS:  Clear to auscultation bilaterally BACK:  No CVA tenderness CHEST:  Unremarkable HEART:  PMI not displaced or sustained,S1 and S2 within normal limits, no S3, no S4, no clicks, no rubs, *** murmurs ABD:  Flat, positive bowel sounds normal in frequency in pitch, no bruits, no rebound, no guarding, no midline pulsatile mass, no hepatomegaly, no splenomegaly EXT:  2  plus pulses throughout, no edema, no cyanosis no clubbing SKIN:  No rashes no nodules NEURO:  Cranial nerves II through XII grossly intact, motor grossly intact throughout PSYCH:  Cognitively intact, oriented to person place and time    EKG:  EKG {ACTION; IS/IS BZJ:[id number]} ordered today. The ekg ordered today demonstrates ***   Recent Labs: 01/09/2022: ALT 19; BUN 13; Creatinine, Ser 0.62; Hemoglobin 14.2; Platelets 282; Potassium 4.3; Sodium 140; TSH 3.460    Lipid Panel    Component Value Date/Time   CHOL 121 01/09/2022 1117   TRIG 132 01/09/2022 1117   HDL 45 01/09/2022 1117   CHOLHDL 2.7 01/09/2022 1117   CHOLHDL 5.3 Ratio 05/03/2008 2048   VLDL 44 (H) 05/03/2008 2048   LDLCALC 53 01/09/2022 1117      Wt Readings from Last 3 Encounters:  03/27/22 228 lb 3.2 oz (103.5 kg)  02/10/22 227 lb (103 kg)  01/09/22 225 lb (102.1 kg)      Other studies Reviewed: Additional studies/ records that were reviewed today include: ***. Review of the above records demonstrates:  Please see elsewhere in the note.  ***   ASSESSMENT AND PLAN:  Palpitations:  ***   Current medicines are reviewed at length with the patient today.  The patient {ACTIONS; HAS/DOES NOT HAVE:19233} concerns regarding medicines.  The following changes have been made:  {PLAN; NO CHANGE:13088:s}  Labs/ tests ordered today include: *** No orders of the defined types were placed in this encounter.    Disposition:   FU with ***    Signed, Rollene Rotunda, MD  04/13/2022 7:49 AM    [facility]

## 2022-04-15 ENCOUNTER — Ambulatory Visit: Payer: Self-pay | Admitting: Cardiology

## 2022-04-15 ENCOUNTER — Encounter: Payer: Self-pay | Admitting: Family Medicine

## 2022-04-15 ENCOUNTER — Ambulatory Visit (INDEPENDENT_AMBULATORY_CARE_PROVIDER_SITE_OTHER): Payer: BC Managed Care – PPO | Admitting: Family Medicine

## 2022-04-15 DIAGNOSIS — Z91199 Patient's noncompliance with other medical treatment and regimen due to unspecified reason: Secondary | ICD-10-CM

## 2022-04-15 DIAGNOSIS — E039 Hypothyroidism, unspecified: Secondary | ICD-10-CM

## 2022-04-15 DIAGNOSIS — E1169 Type 2 diabetes mellitus with other specified complication: Secondary | ICD-10-CM

## 2022-04-15 DIAGNOSIS — R002 Palpitations: Secondary | ICD-10-CM

## 2022-04-16 NOTE — Progress Notes (Signed)
No show for appointment, erroneous encounter.

## 2022-05-08 DIAGNOSIS — Z6834 Body mass index (BMI) 34.0-34.9, adult: Secondary | ICD-10-CM | POA: Diagnosis not present

## 2022-05-08 DIAGNOSIS — J329 Chronic sinusitis, unspecified: Secondary | ICD-10-CM | POA: Diagnosis not present

## 2022-05-08 DIAGNOSIS — R051 Acute cough: Secondary | ICD-10-CM | POA: Diagnosis not present

## 2022-06-09 ENCOUNTER — Other Ambulatory Visit: Payer: Self-pay | Admitting: Family

## 2022-06-09 DIAGNOSIS — M7582 Other shoulder lesions, left shoulder: Secondary | ICD-10-CM

## 2022-06-09 DIAGNOSIS — M25532 Pain in left wrist: Secondary | ICD-10-CM

## 2022-06-10 ENCOUNTER — Encounter: Payer: Self-pay | Admitting: Family Medicine

## 2022-06-10 ENCOUNTER — Ambulatory Visit: Payer: BC Managed Care – PPO | Admitting: Family Medicine

## 2022-06-10 VITALS — BP 148/78 | HR 77 | Temp 98.0°F | Ht 68.0 in | Wt 224.0 lb

## 2022-06-10 DIAGNOSIS — I152 Hypertension secondary to endocrine disorders: Secondary | ICD-10-CM

## 2022-06-10 DIAGNOSIS — E039 Hypothyroidism, unspecified: Secondary | ICD-10-CM

## 2022-06-10 DIAGNOSIS — E1159 Type 2 diabetes mellitus with other circulatory complications: Secondary | ICD-10-CM

## 2022-06-10 DIAGNOSIS — Z23 Encounter for immunization: Secondary | ICD-10-CM | POA: Diagnosis not present

## 2022-06-10 DIAGNOSIS — I7121 Aneurysm of the ascending aorta, without rupture: Secondary | ICD-10-CM | POA: Diagnosis not present

## 2022-06-10 DIAGNOSIS — E1169 Type 2 diabetes mellitus with other specified complication: Secondary | ICD-10-CM | POA: Diagnosis not present

## 2022-06-10 DIAGNOSIS — H60313 Diffuse otitis externa, bilateral: Secondary | ICD-10-CM

## 2022-06-10 LAB — BAYER DCA HB A1C WAIVED: HB A1C (BAYER DCA - WAIVED): 10.5 % — ABNORMAL HIGH (ref 4.8–5.6)

## 2022-06-10 MED ORDER — TRULICITY 3 MG/0.5ML ~~LOC~~ SOAJ: 3.0000 mg | SUBCUTANEOUS | 3 refills | Status: DC

## 2022-06-10 MED ORDER — SITAGLIPTIN PHOSPHATE 50 MG PO TABS: 50.0000 mg | ORAL_TABLET | Freq: Every day | ORAL | 1 refills | Status: DC

## 2022-06-10 MED ORDER — CIPROFLOXACIN-DEXAMETHASONE 0.3-0.1 % OT SUSP: 4.0000 [drp] | Freq: Two times a day (BID) | OTIC | 0 refills | Status: AC

## 2022-06-10 MED ORDER — METOPROLOL SUCCINATE ER 50 MG PO TB24: 50.0000 mg | ORAL_TABLET | Freq: Every day | ORAL | 1 refills | Status: DC

## 2022-06-10 MED ORDER — SITAGLIPTIN PHOSPHATE 25 MG PO TABS: 25.0000 mg | ORAL_TABLET | Freq: Every day | ORAL | 1 refills | Status: DC

## 2022-06-10 MED ORDER — LEVOTHYROXINE SODIUM 50 MCG PO TABS: 50.0000 ug | ORAL_TABLET | Freq: Every day | ORAL | 1 refills | Status: DC

## 2022-06-10 MED ORDER — LOSARTAN POTASSIUM 50 MG PO TABS: 50.0000 mg | ORAL_TABLET | Freq: Every day | ORAL | 1 refills | Status: DC

## 2022-06-10 NOTE — Progress Notes (Signed)
Subjective:  Patient ID: Jenny Willis, female    DOB: [birth year], 56 y.o.   MRN: [id number]  Patient Care Team: Baruch Gouty, FNP as PCP - General (Family Medicine)   Chief Complaint:  Medical Management of Chronic Issues and tendinitis of left rotator cuff (States the shoulder pain is better but now the pain goes down her arm )   HPI: Jenny Willis is a 56 y.o. female presenting on 06/10/2022 for Medical Management of Chronic Issues and tendinitis of left rotator cuff (States the shoulder pain is better but now the pain goes down her arm )  Pt presents today for management of chronic medical conditions. Pt missed last 2 appointments, states she has been sick and was unable to make it. She has a documented history of noncompliance. She is currently being treated by ortho for left shoulder and arm pain. Recently received injections and was placed on pregabalin. States she only takes this at night because that is when the pain is the worst.   1. Type 2 diabetes mellitus with other specified complication, without long-term current use of insulin ([place]) Reports she has been taking trulicity 1.5 mg weekly and januvia 25 mg daily. She has not been following a healthy diet and reports increased sweets intake over the last 8 weeks. She does not follow an exercise routine. No polyuria, polyphagia, or polydipsia.   2. Hypertension associated with diabetes ([place]) Currently on losartan, toprol-xl, and amlodipine. States BP are well controlled at home. Denies headaches, visual changes, confusion, weakness, dizziness, syncope, chest pain, or leg swelling. Has not been following a healthy diet or exercise routine.   3. Acquired hypothyroidism States she has been out of her levothyroxine for over 4 weeks. Reports increased fatigue since being out of medications. No other reported hypothyroid symptoms. Was compliant with medications in the past states she just ran out.   4. Aneurysm of ascending  aorta without rupture (HCC) Pt has not established with new cardiologist, will place new referral today. Discussed the importance of medication compliance and follow up to monitor aneurysm.   5. Severe obesity (BMI 35.0-39.9) with comorbidity ([place]) She has lost 3 lbs since last visit but does report a poor diet over the last several months. She does not follow an exercise routine. States she is going to start working hard on her diet and exercise.      Relevant past medical, surgical, family, and social history reviewed and updated as indicated.  Allergies and medications reviewed and updated. Data reviewed: Chart in Epic.   Past Medical History:  Diagnosis Date   Diabetes mellitus without complication ([place])    Hyperlipidemia    Hypertension    Thyroid disease     Past Surgical History:  Procedure Laterality Date   ABDOMINAL HYSTERECTOMY     BREAST BIOPSY Left    CYSTECTOMY Left     Social History   Socioeconomic History   Marital status: Married    Spouse name: Not on file   Number of children: Not on file   Years of education: Not on file   Highest education level: Not on file  Occupational History   Not on file  Tobacco Use   Smoking status: Former   Smokeless tobacco: Never  Substance and Sexual Activity   Alcohol use: No   Drug use: No   Sexual activity: Yes  Other Topics Concern   Not on file  Social History Narrative   Not on  file   Social Determinants of Health   Financial Resource Strain: Not on file  Food Insecurity: Not on file  Transportation Needs: Not on file  Physical Activity: Not on file  Stress: Not on file  Social Connections: Not on file  Intimate Partner Violence: Not on file    Outpatient Encounter Medications as of 06/10/2022  Medication Sig   albuterol (VENTOLIN HFA) 108 (90 Base) MCG/ACT inhaler Inhale 2 puffs into the lungs every 6 (six) hours as needed for wheezing or shortness of breath.   amLODipine (NORVASC) 2.5 MG tablet  Take by mouth.   aspirin 81 MG chewable tablet Chew by mouth.   butalbital-acetaminophen-caffeine (FIORICET) 50-325-40 MG tablet Take 1 tablet by mouth 2 (two) times daily.   cetirizine (ZYRTEC) 10 MG tablet Take by mouth.   ciprofloxacin-dexamethasone (CIPRODEX) OTIC suspension Place 4 drops into both ears 2 (two) times daily for 7 days.   cyclobenzaprine (FLEXERIL) 10 MG tablet Take 1 tablet (10 mg total) by mouth 3 (three) times daily as needed for muscle spasms.   diclofenac (VOLTAREN) 75 MG EC tablet Take 1 tablet (75 mg total) by mouth 2 (two) times daily.   Dulaglutide (TRULICITY) 3 WN/0.2VO SOPN Inject 3 mg as directed once a week.   fluticasone (FLONASE) 50 MCG/ACT nasal spray Place two sprays in each nostril once daily   hydrochlorothiazide (MICROZIDE) 12.5 MG capsule Take by mouth.   MELATONIN PO Take by mouth.   NON FORMULARY OmegaCo3   NON FORMULARY Liqua-D (K-87)   OVER THE COUNTER MEDICATION Thyroid support   pregabalin (LYRICA) 25 MG capsule Take 25 mg by mouth 2 (two) times daily.   rosuvastatin (CRESTOR) 20 MG tablet Take by mouth.   sitaGLIPtin (JANUVIA) 50 MG tablet Take 1 tablet (50 mg total) by mouth daily.   SUMAtriptan (IMITREX) 100 MG tablet Take by mouth.   [DISCONTINUED] levothyroxine (SYNTHROID) 50 MCG tablet Take by mouth.   [DISCONTINUED] losartan (COZAAR) 50 MG tablet TAKE 1 TABLET BY MOUTH EVERY DAY   [DISCONTINUED] metoprolol succinate (TOPROL-XL) 50 MG 24 hr tablet Take 1 tablet (50 mg total) by mouth daily.   [DISCONTINUED] sitaGLIPtin (JANUVIA) 25 MG tablet Take 1 tablet (25 mg total) by mouth daily.   levothyroxine (SYNTHROID) 50 MCG tablet Take 1 tablet (50 mcg total) by mouth daily before breakfast.   losartan (COZAAR) 50 MG tablet Take 1 tablet (50 mg total) by mouth daily.   metoprolol succinate (TOPROL-XL) 50 MG 24 hr tablet Take 1 tablet (50 mg total) by mouth daily.   [DISCONTINUED] sitaGLIPtin (JANUVIA) 25 MG tablet Take 1 tablet (25 mg total) by  mouth daily.   No facility-administered encounter medications on file as of 06/10/2022.    Allergies  Allergen Reactions   Gabapentin Shortness Of Breath   Sulfa Antibiotics Hives    REACTION: GI upset   Nitrofurantoin Macrocrystal Other (See Comments)    Muscle spasms    Codeine    Metformin     REACTION: diarrhea   Penicillins     REACTION: childhood reaction wtih Scarlett Fever   Sulfonamide Derivatives     REACTION: GI upset   Lisinopril Other (See Comments)    Review of Systems  Constitutional:  Positive for fatigue. Negative for activity change, appetite change, chills, diaphoresis, fever and unexpected weight change.  HENT: Negative.    Eyes: Negative.  Negative for photophobia and visual disturbance.  Respiratory:  Negative for cough, chest tightness and shortness of breath.   Cardiovascular:  Negative for chest pain, palpitations and leg swelling.  Gastrointestinal:  Negative for abdominal distention, abdominal pain, anal bleeding, blood in stool, constipation, diarrhea, nausea, rectal pain and vomiting.  Endocrine: Negative.  Negative for cold intolerance, heat intolerance, polydipsia, polyphagia and polyuria.  Genitourinary:  Negative for decreased urine volume, difficulty urinating, dysuria, frequency and urgency.  Musculoskeletal:  Positive for arthralgias and myalgias. Negative for back pain, gait problem, joint swelling, neck pain and neck stiffness.  Skin: Negative.   Allergic/Immunologic: Negative.   Neurological:  Negative for dizziness, tremors, seizures, syncope, facial asymmetry, speech difficulty, weakness, light-headedness, numbness and headaches.  Hematological: Negative.   Psychiatric/Behavioral:  Negative for confusion, hallucinations, sleep disturbance and suicidal ideas.   All other systems reviewed and are negative.       Objective:  BP (!) 148/78   Pulse 77   Temp 98 F (36.7 C) (Temporal)   Ht 5' 8"  (1.727 m)   Wt 224 lb (101.6 kg)    SpO2 95%   BMI 34.06 kg/m    Wt Readings from Last 3 Encounters:  06/10/22 224 lb (101.6 kg)  03/27/22 228 lb 3.2 oz (103.5 kg)  02/10/22 227 lb (103 kg)    Physical Exam Vitals and nursing note reviewed.  Constitutional:      General: She is not in acute distress.    Appearance: Normal appearance. She is well-developed and well-groomed. She is obese. She is not ill-appearing, toxic-appearing or diaphoretic.  HENT:     Head: Normocephalic and atraumatic.     Jaw: There is normal jaw occlusion.     Right Ear: Hearing normal. Swelling and tenderness present.     Left Ear: Hearing normal. Swelling and tenderness present.     Ears:     Comments: External canal tender, swollen, and erythematous bilaterally    Nose: Nose normal.     Mouth/Throat:     Lips: Pink.     Mouth: Mucous membranes are moist.     Pharynx: Uvula midline.  Eyes:     General: Lids are normal.     Pupils: Pupils are equal, round, and reactive to light.  Neck:     Thyroid: No thyroid mass, thyromegaly or thyroid tenderness.     Vascular: No JVD.     Trachea: Trachea and phonation normal.  Cardiovascular:     Rate and Rhythm: Normal rate and regular rhythm.     Chest Wall: PMI is not displaced.     Pulses: Normal pulses.     Heart sounds: Normal heart sounds. No murmur heard.    No friction rub. No gallop.  Pulmonary:     Effort: Pulmonary effort is normal. No respiratory distress.     Breath sounds: Normal breath sounds. No wheezing.  Abdominal:     General: Bowel sounds are normal. There is no distension or abdominal bruit.     Palpations: Abdomen is soft. There is no hepatomegaly or splenomegaly.  Musculoskeletal:     Cervical back: Neck supple.     Right lower leg: No edema.     Left lower leg: No edema.  Skin:    General: Skin is warm and dry.     Capillary Refill: Capillary refill takes less than 2 seconds.     Coloration: Skin is not cyanotic, jaundiced or pale.     Findings: No rash.   Neurological:     General: No focal deficit present.     Mental Status: She is alert and oriented to  person, place, and time.     Sensory: Sensation is intact.     Motor: Motor function is intact.     Coordination: Coordination is intact.     Gait: Gait is intact.     Deep Tendon Reflexes: Reflexes are normal and symmetric.  Psychiatric:        Attention and Perception: Attention and perception normal.        Mood and Affect: Mood and affect normal.        Speech: Speech normal.        Behavior: Behavior normal. Behavior is cooperative.        Thought Content: Thought content normal.        Cognition and Memory: Cognition and memory normal.        Judgment: Judgment normal.     Results for orders placed or performed in visit on 02/10/22  Glucose Hemocue Waived  Result Value Ref Range   Glu Hemocue Waived 310 (H) 70 - 99 mg/dL       Pertinent labs & imaging results that were available during my care of the patient were reviewed by me and considered in my medical decision making.  Assessment & Plan:  Saiya was seen today for medical management of chronic issues and tendinitis of left rotator cuff.  Diagnoses and all orders for this visit:  Type 2 diabetes mellitus with other specified complication, without long-term current use of insulin ([place]) Not controlled today, A1C 10.8. Diet, exercise, and medication compliance discussed in detail. Will increase dosing on medications today. Pt aware to follow up in 3 months for repeat labs. -     Bayer DCA Hb A1c Waived -     CMP14+EGFR -     sitaGLIPtin (JANUVIA) 50 MG tablet; Take 1 tablet (50 mg total) by mouth daily. -     Dulaglutide (TRULICITY) 3 MB/5.5HR SOPN; Inject 3 mg as directed once a week.  Hypertension associated with diabetes ([place]) BP fairly controlled. Changes were not made in regimen today. Aware of importance of compliance. Goal BP is 130/80. Pt aware to report any persistent high or low readings. DASH diet and  exercise encouraged. Exercise at least 150 minutes per week and increase as tolerated. Goal BMI > 25. Stress management encouraged. Avoid nicotine and tobacco product use. Avoid excessive alcohol and NSAID's. Avoid more than 2000 mg of sodium daily. Medications as prescribed. Follow up as scheduled.  -     CMP14+EGFR -     metoprolol succinate (TOPROL-XL) 50 MG 24 hr tablet; Take 1 tablet (50 mg total) by mouth daily. -     losartan (COZAAR) 50 MG tablet; Take 1 tablet (50 mg total) by mouth daily.  Acquired hypothyroidism Thyroid disease has been well controlled. Labs are pending. Adjustments to regimen will be made if warranted. Make sure to take medications on an empty stomach with a full glass of water. Make sure to avoid vitamins or supplements for at least 4 hours before and 4 hours after taking medications. Repeat labs in 3 months if adjustments are made and in 6 months if stable.   -     levothyroxine (SYNTHROID) 50 MCG tablet; Take 1 tablet (50 mcg total) by mouth daily before breakfast. -     Thyroid Panel With TSH  Aneurysm of ascending aorta without rupture (HCC) Has not followed up with cardiology, will place new referral today.  -     Ambulatory referral to Cardiology  Severe obesity (BMI 35.0-39.9) with  comorbidity ([place]) Diet and exercise encouraged.   Acute diffuse otitis externa of both ears Reports using earbuds nightly. Preventative measures discussed in detail. Drops as prescribed. Report new, worsening, or persistent symptoms.  -     ciprofloxacin-dexamethasone (CIPRODEX) OTIC suspension; Place 4 drops into both ears 2 (two) times daily for 7 days.  Need for shingles vaccine -     Zoster Recombinant (Shingrix )     Continue all other maintenance medications.  Follow up plan: Return in about 3 months (around 09/10/2022), or if symptoms worsen or fail to improve, for DM.   Continue healthy lifestyle choices, including diet (rich in fruits, vegetables, and lean  proteins, and low in salt and simple carbohydrates) and exercise (at least 30 minutes of moderate physical activity daily).  Educational handout given for DM  The above assessment and management plan was discussed with the patient. The patient verbalized understanding of and has agreed to the management plan. Patient is aware to call the clinic if they develop any new symptoms or if symptoms persist or worsen. Patient is aware when to return to the clinic for a follow-up visit. Patient educated on when it is appropriate to go to the emergency department.   Monia Pouch, FNP-C [facility] [phone]

## 2022-06-10 NOTE — Patient Instructions (Signed)

## 2022-06-11 LAB — CMP14+EGFR
ALT: 15 IU/L (ref 0–32)
AST: 12 IU/L (ref 0–40)
Albumin/Globulin Ratio: 1.4 (ref 1.2–2.2)
Albumin: 4 g/dL (ref 3.8–4.9)
Alkaline Phosphatase: 83 IU/L (ref 44–121)
BUN/Creatinine Ratio: 17 (ref 9–23)
BUN: 11 mg/dL (ref 6–24)
Bilirubin Total: 0.3 mg/dL (ref 0.0–1.2)
CO2: 24 mmol/L (ref 20–29)
Calcium: 9.5 mg/dL (ref 8.7–10.2)
Chloride: 99 mmol/L (ref 96–106)
Creatinine, Ser: 0.64 mg/dL (ref 0.57–1.00)
Globulin, Total: 2.8 g/dL (ref 1.5–4.5)
Glucose: 229 mg/dL — ABNORMAL HIGH (ref 70–99)
Potassium: 4.5 mmol/L (ref 3.5–5.2)
Sodium: 137 mmol/L (ref 134–144)
Total Protein: 6.8 g/dL (ref 6.0–8.5)
eGFR: 102 mL/min/{1.73_m2} (ref >59)

## 2022-06-12 ENCOUNTER — Other Ambulatory Visit: Payer: Self-pay | Admitting: Family

## 2022-06-12 DIAGNOSIS — M25532 Pain in left wrist: Secondary | ICD-10-CM

## 2022-06-12 DIAGNOSIS — M7582 Other shoulder lesions, left shoulder: Secondary | ICD-10-CM

## 2022-06-12 LAB — THYROID PANEL WITH TSH
Free Thyroxine Index: 1.8 (ref 1.2–4.9)
T3 Uptake Ratio: 25 % (ref 24–39)
T4, Total: 7.3 ug/dL (ref 4.5–12.0)
TSH: 4.11 u[IU]/mL (ref 0.450–4.500)

## 2022-06-12 LAB — SPECIMEN STATUS REPORT

## 2022-06-13 NOTE — Telephone Encounter (Signed)
Office visit 06/10/22 Last refill 03/27/22, #90, 1 refill

## 2022-06-29 DIAGNOSIS — M792 Neuralgia and neuritis, unspecified: Secondary | ICD-10-CM | POA: Diagnosis not present

## 2022-07-06 ENCOUNTER — Other Ambulatory Visit: Payer: Self-pay | Admitting: Family

## 2022-07-06 DIAGNOSIS — M25532 Pain in left wrist: Secondary | ICD-10-CM

## 2022-07-06 DIAGNOSIS — M7582 Other shoulder lesions, left shoulder: Secondary | ICD-10-CM

## 2022-07-11 DIAGNOSIS — M792 Neuralgia and neuritis, unspecified: Secondary | ICD-10-CM | POA: Diagnosis not present

## 2022-07-15 DIAGNOSIS — M792 Neuralgia and neuritis, unspecified: Secondary | ICD-10-CM | POA: Diagnosis not present

## 2022-07-21 DIAGNOSIS — I77819 Aortic ectasia, unspecified site: Secondary | ICD-10-CM | POA: Insufficient documentation
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
## 2022-07-22 ENCOUNTER — Encounter: Payer: Self-pay | Admitting: Cardiology

## 2022-07-22 ENCOUNTER — Ambulatory Visit (INDEPENDENT_AMBULATORY_CARE_PROVIDER_SITE_OTHER): Payer: BC Managed Care – PPO | Admitting: Cardiology

## 2022-07-22 VITALS — BP 146/94 | HR 113 | Ht 67.0 in | Wt 221.4 lb

## 2022-07-22 DIAGNOSIS — I152 Hypertension secondary to endocrine disorders: Secondary | ICD-10-CM | POA: Diagnosis not present

## 2022-07-22 DIAGNOSIS — I77819 Aortic ectasia, unspecified site: Secondary | ICD-10-CM

## 2022-07-22 DIAGNOSIS — N3001 Acute cystitis with hematuria: Secondary | ICD-10-CM | POA: Diagnosis not present

## 2022-07-22 DIAGNOSIS — I10 Essential (primary) hypertension: Secondary | ICD-10-CM | POA: Diagnosis not present

## 2022-07-22 DIAGNOSIS — R300 Dysuria: Secondary | ICD-10-CM | POA: Diagnosis not present

## 2022-07-22 DIAGNOSIS — N3080 Other cystitis without hematuria: Secondary | ICD-10-CM | POA: Diagnosis not present

## 2022-07-22 DIAGNOSIS — R3915 Urgency of urination: Secondary | ICD-10-CM | POA: Diagnosis not present

## 2022-07-22 DIAGNOSIS — E1159 Type 2 diabetes mellitus with other circulatory complications: Secondary | ICD-10-CM | POA: Diagnosis not present

## 2022-07-22 MED ORDER — METOPROLOL SUCCINATE ER 100 MG PO TB24: 100.0000 mg | ORAL_TABLET | Freq: Every day | ORAL | 3 refills | Status: DC

## 2022-07-22 NOTE — Patient Instructions (Signed)
Medication Instructions:  Please increase your Metoprolol Succinate to 100 mg a day. Continue all other medications as listed.  *If you need a refill on your cardiac medications before your next appointment, please call your pharmacy*   Testing/Procedures: CT of aorta (CAT scanning), is a noninvasive, special x-ray that produces cross-sectional images of the body using x-rays and a computer. CT scans help physicians diagnose and treat medical conditions. For some CT exams, a contrast material is used to enhance visibility in the area of the body being studied. CT scans provide greater clarity and reveal more details than regular x-ray exams. This will be completed at [facility] and you will be contacted to be scheduled.  Follow-Up: At [facility], you and your health needs are our priority.  As part of our continuing mission to provide you with exceptional heart care, we have created designated Provider Care Teams.  These Care Teams include your primary Cardiologist (physician) and Advanced Practice Providers (APPs -  Physician Assistants and Nurse Practitioners) who all work together to provide you with the care you need, when you need it.  We recommend signing up for the patient portal called "MyChart".  Sign up information is provided on this After Visit Summary.  MyChart is used to connect with patients for Virtual Visits (Telemedicine).  Patients are able to view lab/test results, encounter notes, upcoming appointments, etc.  Non-urgent messages can be sent to your provider as well.   To learn more about what you can do with MyChart, go to [email or web address].    Your next appointment:   1 year(s)  The format for your next appointment:   In Person  Provider:   Rollene Rotunda, MD    Important Information About Sugar

## 2022-07-28 DIAGNOSIS — M5416 Radiculopathy, lumbar region: Secondary | ICD-10-CM | POA: Diagnosis not present

## 2022-07-30 DIAGNOSIS — M5416 Radiculopathy, lumbar region: Secondary | ICD-10-CM | POA: Diagnosis not present

## 2022-08-01 ENCOUNTER — Other Ambulatory Visit: Payer: Self-pay | Admitting: Family

## 2022-08-01 DIAGNOSIS — M7582 Other shoulder lesions, left shoulder: Secondary | ICD-10-CM

## 2022-08-01 DIAGNOSIS — M25532 Pain in left wrist: Secondary | ICD-10-CM

## 2022-08-03 ENCOUNTER — Encounter: Payer: Self-pay | Admitting: *Deleted

## 2022-08-31 ENCOUNTER — Ambulatory Visit (HOSPITAL_COMMUNITY): Admission: RE | Admit: 2022-08-31 | Payer: No Typology Code available for payment source | Source: Ambulatory Visit

## 2022-09-01 ENCOUNTER — Other Ambulatory Visit: Payer: Self-pay | Admitting: Family

## 2022-09-01 DIAGNOSIS — M25532 Pain in left wrist: Secondary | ICD-10-CM

## 2022-09-01 DIAGNOSIS — M7582 Other shoulder lesions, left shoulder: Secondary | ICD-10-CM

## 2022-09-11 ENCOUNTER — Ambulatory Visit: Payer: BC Managed Care – PPO | Admitting: Family Medicine

## 2022-09-15 ENCOUNTER — Ambulatory Visit (INDEPENDENT_AMBULATORY_CARE_PROVIDER_SITE_OTHER): Payer: No Typology Code available for payment source

## 2022-09-15 ENCOUNTER — Encounter: Payer: Self-pay | Admitting: Family Medicine

## 2022-09-15 ENCOUNTER — Ambulatory Visit (INDEPENDENT_AMBULATORY_CARE_PROVIDER_SITE_OTHER): Payer: No Typology Code available for payment source | Admitting: Family Medicine

## 2022-09-15 VITALS — BP 138/91 | HR 75 | Temp 97.8°F | Ht 67.0 in | Wt 216.6 lb

## 2022-09-15 DIAGNOSIS — E6609 Other obesity due to excess calories: Secondary | ICD-10-CM

## 2022-09-15 DIAGNOSIS — I152 Hypertension secondary to endocrine disorders: Secondary | ICD-10-CM

## 2022-09-15 DIAGNOSIS — E1169 Type 2 diabetes mellitus with other specified complication: Secondary | ICD-10-CM

## 2022-09-15 DIAGNOSIS — I77819 Aortic ectasia, unspecified site: Secondary | ICD-10-CM | POA: Diagnosis not present

## 2022-09-15 DIAGNOSIS — M7732 Calcaneal spur, left foot: Secondary | ICD-10-CM

## 2022-09-15 DIAGNOSIS — E1159 Type 2 diabetes mellitus with other circulatory complications: Secondary | ICD-10-CM

## 2022-09-15 DIAGNOSIS — M79675 Pain in left toe(s): Secondary | ICD-10-CM

## 2022-09-15 DIAGNOSIS — Z6833 Body mass index (BMI) 33.0-33.9, adult: Secondary | ICD-10-CM

## 2022-09-15 DIAGNOSIS — E785 Hyperlipidemia, unspecified: Secondary | ICD-10-CM

## 2022-09-15 LAB — BAYER DCA HB A1C WAIVED: HB A1C (BAYER DCA - WAIVED): 10.1 % — ABNORMAL HIGH (ref 4.8–5.6)

## 2022-09-15 MED ORDER — TIRZEPATIDE 5 MG/0.5ML ~~LOC~~ SOAJ: 5.0000 mg | SUBCUTANEOUS | 3 refills | Status: DC

## 2022-09-15 NOTE — Progress Notes (Signed)
Subjective:  Patient ID: Jenny Willis, female    DOB: [birth year], 57 y.o.   MRN: [id number]  Patient Care Team: Sonny Masters, FNP as PCP - General (Family Medicine)   Chief Complaint:  Diabetes (3 month follow up)   HPI: Jenny Willis is a 57 y.o. female presenting on 09/15/2022 for Diabetes (3 month follow up)   Presents today for management of chronic medical conditions and for left toe pain for several months. No reported injuries to toe. States she has a knot to the plantar aspect of her left third toe which is tender to palpation. No erythema or swelling. No trouble walking. States it is making her toes feel numb. Has not seen podiatry for this. Was told she had spurs in the past.    1. Type 2 diabetes mellitus with other specified complication, without long-term current use of insulin (HCC) Has been taking Trulicity and Januvia as prescribed. Still having blood sugar readings in the 300 range. Denies polyuria, polyphagia, or polydipsia. No visual changes.   2. Hypertension associated with diabetes (HCC) Has been compliant with medications. Not compliant with diet and exercise. No chest pain, leg swelling, weakness, confusion, headaches, visual changes, or palpitations.   3. Severe obesity (BMI 35.0-39.9) with comorbidity (HCC) Has lost 8 lbs since last visit. Admits to not following a strict diet or exercise routine.   4. Hyperlipidemia associated with type 2 diabetes mellitus (HCC) On Crestor and feels she is tolerating well. Does not follow a diet or exercise routine. No increased myalgias.   5. Acquired dilation of ascending aorta and aortic root (HCC) Has CT scheduled for tomorrow. Has not followed up with cardiology, aware to make appointment.     Relevant past medical, surgical, family, and social history reviewed and updated as indicated.  Allergies and medications reviewed and updated. Data reviewed: Chart in Epic.   Past Medical History:  Diagnosis  Date   Diabetes mellitus without complication (HCC)    Hyperlipidemia    Hypertension    Thyroid disease     Past Surgical History:  Procedure Laterality Date   ABDOMINAL HYSTERECTOMY     BREAST BIOPSY Left    CYSTECTOMY Left     Social History   Socioeconomic History   Marital status: Married    Spouse name: Not on file   Number of children: Not on file   Years of education: Not on file   Highest education level: Not on file  Occupational History   Not on file  Tobacco Use   Smoking status: Former   Smokeless tobacco: Never  Substance and Sexual Activity   Alcohol use: No   Drug use: No   Sexual activity: Yes  Other Topics Concern   Not on file  Social History Narrative   Not on file   Social Determinants of Health   Financial Resource Strain: Not on file  Food Insecurity: Not on file  Transportation Needs: Not on file  Physical Activity: Not on file  Stress: Not on file  Social Connections: Not on file  Intimate Partner Violence: Not on file    Outpatient Encounter Medications as of 09/15/2022  Medication Sig   albuterol (VENTOLIN HFA) 108 (90 Base) MCG/ACT inhaler Inhale 2 puffs into the lungs every 6 (six) hours as needed for wheezing or shortness of breath.   amLODipine (NORVASC) 2.5 MG tablet Take by mouth.   aspirin 81 MG chewable tablet Chew by mouth.   butalbital-acetaminophen-caffeine (  FIORICET) 50-325-40 MG tablet Take 1 tablet by mouth 2 (two) times daily.   cetirizine (ZYRTEC) 10 MG tablet Take by mouth.   cyclobenzaprine (FLEXERIL) 10 MG tablet Take 1 tablet (10 mg total) by mouth 3 (three) times daily as needed for muscle spasms.   diclofenac (VOLTAREN) 75 MG EC tablet TAKE 1 TABLET BY MOUTH TWICE A DAY   fluticasone (FLONASE) 50 MCG/ACT nasal spray Place two sprays in each nostril once daily   hydrochlorothiazide (MICROZIDE) 12.5 MG capsule Take by mouth.   levothyroxine (SYNTHROID) 50 MCG tablet Take 1 tablet (50 mcg total) by mouth daily  before breakfast.   losartan (COZAAR) 50 MG tablet Take 1 tablet (50 mg total) by mouth daily.   MELATONIN PO Take by mouth.   metoprolol succinate (TOPROL-XL) 100 MG 24 hr tablet Take 1 tablet (100 mg total) by mouth daily. Take with or immediately following a meal.   NON FORMULARY OmegaCo3   NON FORMULARY Liqua-D (K-87)   OVER THE COUNTER MEDICATION Thyroid support   pregabalin (LYRICA) 25 MG capsule Take 25 mg by mouth 2 (two) times daily.   rosuvastatin (CRESTOR) 20 MG tablet Take by mouth.   sitaGLIPtin (JANUVIA) 50 MG tablet Take 1 tablet (50 mg total) by mouth daily.   SUMAtriptan (IMITREX) 100 MG tablet Take by mouth.   tirzepatide (MOUNJARO) 5 MG/0.5ML Pen Inject 5 mg into the skin once a week.   [DISCONTINUED] Dulaglutide (TRULICITY) 3 TD/3.2KG SOPN Inject 3 mg as directed once a week.   [DISCONTINUED] predniSONE (DELTASONE) 10 MG tablet Take 10 mg by mouth daily.   [DISCONTINUED] TRULICITY 1.5 UR/4.2HC SOPN Inject 1.5 mg as directed once a week.   No facility-administered encounter medications on file as of 09/15/2022.    Allergies  Allergen Reactions   Gabapentin Shortness Of Breath   Sulfa Antibiotics Hives    REACTION: GI upset   Nitrofurantoin Macrocrystal Other (See Comments)    Muscle spasms    Codeine    Metformin     REACTION: diarrhea   Penicillins     REACTION: childhood reaction wtih Scarlett Fever   Sulfonamide Derivatives     REACTION: GI upset   Lisinopril Other (See Comments)    Review of Systems  Constitutional:  Negative for activity change, appetite change, chills, diaphoresis, fever and unexpected weight change.  HENT: Negative.    Eyes: Negative.  Negative for photophobia and visual disturbance.  Respiratory:  Negative for cough, chest tightness and shortness of breath.   Cardiovascular:  Negative for palpitations and leg swelling.  Gastrointestinal:  Negative for abdominal pain, blood in stool, constipation, diarrhea, nausea and vomiting.   Endocrine: Negative.   Genitourinary:  Negative for decreased urine volume, difficulty urinating, dysuria, frequency and urgency.  Musculoskeletal:  Positive for arthralgias, back pain and myalgias. Negative for gait problem, joint swelling, neck pain and neck stiffness.  Skin: Negative.   Allergic/Immunologic: Negative.   Neurological:  Negative for syncope, facial asymmetry, light-headedness and numbness.  Hematological: Negative.   Psychiatric/Behavioral:  Negative for hallucinations, sleep disturbance and suicidal ideas.   All other systems reviewed and are negative.       Objective:  BP (!) 138/91   Pulse 75   Temp 97.8 F (36.6 C) (Temporal)   Ht 5\' 7"  (1.702 m)   Wt 216 lb 9.6 oz (98.2 kg)   SpO2 97%   BMI 33.92 kg/m    Wt Readings from Last 3 Encounters:  09/15/22 216 lb 9.6 oz (  98.2 kg)  07/22/22 221 lb 6.4 oz (100.4 kg)  06/10/22 224 lb (101.6 kg)    Physical Exam Vitals and nursing note reviewed.  Constitutional:      General: She is not in acute distress.    Appearance: Normal appearance. She is well-developed and well-groomed. She is obese. She is not ill-appearing, toxic-appearing or diaphoretic.  HENT:     Head: Normocephalic and atraumatic.     Jaw: There is normal jaw occlusion.     Right Ear: Hearing normal.     Left Ear: Hearing normal.     Nose: Nose normal.     Mouth/Throat:     Lips: Pink.     Mouth: Mucous membranes are moist.     Pharynx: Oropharynx is clear. Uvula midline.  Eyes:     General: Lids are normal.     Extraocular Movements: Extraocular movements intact.     Conjunctiva/sclera: Conjunctivae normal.     Pupils: Pupils are equal, round, and reactive to light.  Neck:     Thyroid: No thyroid mass, thyromegaly or thyroid tenderness.     Vascular: No carotid bruit or JVD.     Trachea: Trachea and phonation normal.  Cardiovascular:     Rate and Rhythm: Normal rate and regular rhythm.     Chest Wall: PMI is not displaced.      Pulses: Normal pulses.          Dorsalis pedis pulses are 2+ on the left side.       Posterior tibial pulses are 2+ on the left side.     Heart sounds: Normal heart sounds. No murmur heard.    No friction rub. No gallop.  Pulmonary:     Effort: Pulmonary effort is normal. No respiratory distress.     Breath sounds: Normal breath sounds. No wheezing.  Abdominal:     General: Bowel sounds are normal. There is no distension or abdominal bruit.     Palpations: Abdomen is soft. There is no hepatomegaly or splenomegaly.     Tenderness: There is no abdominal tenderness. There is no right CVA tenderness or left CVA tenderness.     Hernia: No hernia is present.  Musculoskeletal:        General: Normal range of motion.     Cervical back: Normal range of motion and neck supple.     Right lower leg: No edema.     Left lower leg: No edema.       Feet:  Feet:     Left foot:     Skin integrity: Skin integrity normal.  Lymphadenopathy:     Cervical: No cervical adenopathy.  Skin:    General: Skin is warm and dry.     Capillary Refill: Capillary refill takes less than 2 seconds.     Coloration: Skin is not cyanotic, jaundiced or pale.     Findings: No rash.  Neurological:     General: No focal deficit present.     Mental Status: She is alert and oriented to person, place, and time.     Sensory: Sensation is intact.     Motor: Motor function is intact.     Coordination: Coordination is intact.     Gait: Gait is intact.     Deep Tendon Reflexes: Reflexes are normal and symmetric.  Psychiatric:        Attention and Perception: Attention and perception normal.        Mood and Affect: Mood and affect  normal.        Speech: Speech normal.        Behavior: Behavior normal. Behavior is cooperative.        Thought Content: Thought content normal.        Cognition and Memory: Cognition and memory normal.        Judgment: Judgment normal.     Results for orders placed or performed in visit on  06/10/22  Bayer DCA Hb A1c Waived  Result Value Ref Range   HB A1C (BAYER DCA - WAIVED) 10.5 (H) 4.8 - 5.6 %  CMP14+EGFR  Result Value Ref Range   Glucose 229 (H) 70 - 99 mg/dL   BUN 11 6 - 24 mg/dL   Creatinine, Ser 0.64 0.57 - 1.00 mg/dL   eGFR 102 >59 mL/min/1.73   BUN/Creatinine Ratio 17 9 - 23   Sodium 137 134 - 144 mmol/L   Potassium 4.5 3.5 - 5.2 mmol/L   Chloride 99 96 - 106 mmol/L   CO2 24 20 - 29 mmol/L   Calcium 9.5 8.7 - 10.2 mg/dL   Total Protein 6.8 6.0 - 8.5 g/dL   Albumin 4.0 3.8 - 4.9 g/dL   Globulin, Total 2.8 1.5 - 4.5 g/dL   Albumin/Globulin Ratio 1.4 1.2 - 2.2   Bilirubin Total 0.3 0.0 - 1.2 mg/dL   Alkaline Phosphatase 83 44 - 121 IU/L   AST 12 0 - 40 IU/L   ALT 15 0 - 32 IU/L  Thyroid Panel With TSH  Result Value Ref Range   TSH 4.110 0.450 - 4.500 uIU/mL   T4, Total 7.3 4.5 - 12.0 ug/dL   T3 Uptake Ratio 25 24 - 39 %   Free Thyroxine Index 1.8 1.2 - 4.9  Specimen status report  Result Value Ref Range   specimen status report Comment      X-Ray: left foot: calcaneal spurs, sesamoid bones, degenerative changes, no acute findings. Preliminary x-ray reading by Monia Pouch, FNP-C, [facility].   Pertinent labs & imaging results that were available during my care of the patient were reviewed by me and considered in my medical decision making.  Assessment & Plan:  Nimra was seen today for diabetes.  Diagnoses and all orders for this visit:  Type 2 diabetes mellitus with other specified complication, without long-term current use of insulin (HCC) A1C 10.1, not controlled. Will start Mounjaro and stop trulicity for better D3U control and weight reduction. Diet and exercise discussed in detail. Follow up in 3 months for reevaluation.  -     Bayer DCA Hb A1c Waived -     tirzepatide (MOUNJARO) 5 MG/0.5ML Pen; Inject 5 mg into the skin once a week. -     Ambulatory referral to Podiatry  Hypertension associated with diabetes [facility]) Not well controlled.  Discussed DASH diet and exercise in detail. Will adjust regimen at next visit if not at goal.  -     CMP14+EGFR -     tirzepatide [facility]) 5 MG/0.5ML Pen; Inject 5 mg into the skin once a week.  Class 1 obesity due to excess calories with serious comorbidity and body mass index (BMI) of 33.0 to 33.9 in adult Diet and exercise encouraged. Pt aware to keep food and exercise diary.  -     tirzepatide [facility]) 5 MG/0.5ML Pen; Inject 5 mg into the skin once a week.  Hyperlipidemia associated with type 2 diabetes mellitus ([place]) Diet encouraged - increase intake of fresh fruits and vegetables, increase intake  of lean proteins. Bake, broil, or grill foods. Avoid fried, greasy, and fatty foods. Avoid fast foods. Increase intake of fiber-rich whole grains. Exercise encouraged - at least 150 minutes per week and advance as tolerated.  Goal BMI < 25. Continue medications as prescribed. Follow up in 3-6 months as discussed.  -     tirzepatide [facility]) 5 MG/0.5ML Pen; Inject 5 mg into the skin once a week.  Acquired dilation of ascending aorta and aortic root (HCC) Has CT scheduled tomorrow. Aware to make follow up with cardiology.   Pain of toe of left foot Calcaneal spur of foot, left Referral to podiatry placed.  -     DG Toe 3rd Left; Future -     Ambulatory referral to Podiatry     Continue all other maintenance medications.  Follow up plan: Return in about 3 months (around 12/15/2022) for All labs, chronic follow up.   Continue healthy lifestyle choices, including diet (rich in fruits, vegetables, and lean proteins, and low in salt and simple carbohydrates) and exercise (at least 30 minutes of moderate physical activity daily).  Educational handout given for DM  The above assessment and management plan was discussed with the patient. The patient verbalized understanding of and has agreed to the management plan. Patient is aware to call the clinic if they develop any new symptoms or if  symptoms persist or worsen. Patient is aware when to return to the clinic for a follow-up visit. Patient educated on when it is appropriate to go to the emergency department.   Kari Baars, FNP-C [facility] [phone]

## 2022-09-15 NOTE — Patient Instructions (Addendum)

## 2022-09-16 ENCOUNTER — Ambulatory Visit (HOSPITAL_COMMUNITY): Payer: No Typology Code available for payment source

## 2022-09-16 ENCOUNTER — Encounter (HOSPITAL_COMMUNITY): Payer: Self-pay

## 2022-09-16 LAB — CMP14+EGFR
ALT: 21 IU/L (ref 0–32)
AST: 10 IU/L (ref 0–40)
Albumin/Globulin Ratio: 1.4 (ref 1.2–2.2)
Albumin: 4 g/dL (ref 3.8–4.9)
Alkaline Phosphatase: 73 IU/L (ref 44–121)
BUN/Creatinine Ratio: 16 (ref 9–23)
BUN: 12 mg/dL (ref 6–24)
Bilirubin Total: 0.5 mg/dL (ref 0.0–1.2)
CO2: 24 mmol/L (ref 20–29)
Calcium: 9.3 mg/dL (ref 8.7–10.2)
Chloride: 101 mmol/L (ref 96–106)
Creatinine, Ser: 0.76 mg/dL (ref 0.57–1.00)
Globulin, Total: 2.8 g/dL (ref 1.5–4.5)
Glucose: 193 mg/dL — ABNORMAL HIGH (ref 70–99)
Potassium: 4.3 mmol/L (ref 3.5–5.2)
Sodium: 137 mmol/L (ref 134–144)
Total Protein: 6.8 g/dL (ref 6.0–8.5)
eGFR: 90 mL/min/{1.73_m2} (ref >59)

## 2022-09-24 ENCOUNTER — Other Ambulatory Visit: Payer: Self-pay | Admitting: Family Medicine

## 2022-09-24 DIAGNOSIS — R0602 Shortness of breath: Secondary | ICD-10-CM

## 2022-09-29 ENCOUNTER — Ambulatory Visit (HOSPITAL_COMMUNITY)
Admission: RE | Admit: 2022-09-29 | Discharge: 2022-09-29 | Disposition: A | Discharge status: Home / Self Care | Payer: No Typology Code available for payment source | Source: Ambulatory Visit | Attending: Cardiology | Admitting: Cardiology

## 2022-09-29 DIAGNOSIS — I77819 Aortic ectasia, unspecified site: Secondary | ICD-10-CM | POA: Diagnosis present

## 2022-09-29 DIAGNOSIS — E1159 Type 2 diabetes mellitus with other circulatory complications: Secondary | ICD-10-CM | POA: Diagnosis present

## 2022-09-29 DIAGNOSIS — I152 Hypertension secondary to endocrine disorders: Secondary | ICD-10-CM | POA: Insufficient documentation

## 2022-09-29 MED ORDER — IOHEXOL 350 MG/ML SOLN
100.0000 mL | Freq: Once | INTRAVENOUS | Status: AC | PRN
  Administered 2022-09-29: 100 mL via INTRAVENOUS

## 2022-09-30 ENCOUNTER — Encounter: Payer: Self-pay | Admitting: Family Medicine

## 2022-10-03 ENCOUNTER — Other Ambulatory Visit: Payer: Self-pay | Admitting: Family Medicine

## 2022-10-03 DIAGNOSIS — M7582 Other shoulder lesions, left shoulder: Secondary | ICD-10-CM

## 2022-10-03 DIAGNOSIS — M25532 Pain in left wrist: Secondary | ICD-10-CM

## 2022-12-01 ENCOUNTER — Other Ambulatory Visit: Payer: Self-pay | Admitting: Family Medicine

## 2022-12-01 DIAGNOSIS — M25532 Pain in left wrist: Secondary | ICD-10-CM

## 2022-12-01 DIAGNOSIS — M7582 Other shoulder lesions, left shoulder: Secondary | ICD-10-CM

## 2022-12-02 ENCOUNTER — Other Ambulatory Visit: Payer: Self-pay | Admitting: Family Medicine

## 2022-12-02 DIAGNOSIS — E039 Hypothyroidism, unspecified: Secondary | ICD-10-CM

## 2022-12-03 ENCOUNTER — Other Ambulatory Visit: Payer: Self-pay | Admitting: Family Medicine

## 2022-12-04 ENCOUNTER — Ambulatory Visit
Admission: RE | Admit: 2022-12-04 | Discharge: 2022-12-04 | Disposition: A | Discharge status: Home / Self Care | Payer: No Typology Code available for payment source | Source: Ambulatory Visit | Attending: Internal Medicine

## 2022-12-04 ENCOUNTER — Other Ambulatory Visit: Payer: Self-pay

## 2022-12-04 VITALS — BP 172/104 | HR 86 | Temp 97.9°F | Resp 16

## 2022-12-04 DIAGNOSIS — R300 Dysuria: Secondary | ICD-10-CM | POA: Diagnosis not present

## 2022-12-04 DIAGNOSIS — I10 Essential (primary) hypertension: Secondary | ICD-10-CM | POA: Diagnosis not present

## 2022-12-04 LAB — POCT URINALYSIS DIP (MANUAL ENTRY)
Blood, UA: NEGATIVE
Glucose, UA: NEGATIVE mg/dL
Ketones, POC UA: NEGATIVE mg/dL
Leukocytes, UA: NEGATIVE
Nitrite, UA: NEGATIVE
Protein Ur, POC: 30 mg/dL — AB
Spec Grav, UA: >=1.03 — AB (ref 1.010–1.025)
Urobilinogen, UA: 0.2 E.U./dL
pH, UA: 5.5 (ref 5.0–8.0)

## 2022-12-04 MED ORDER — CIPROFLOXACIN HCL 500 MG PO TABS: 500.0000 mg | ORAL_TABLET | Freq: Two times a day (BID) | ORAL | 0 refills | Status: AC

## 2022-12-04 NOTE — Discharge Instructions (Signed)
You were prescribed Cipro which is an antibiotic that is often used to treat urinary tract infections.  Take the prescription as directed. A urine culture has been sent to the lab for further testing.  We will call you with those results.  If the prescription needs to be changed, it will be done so at that time.   Return in 2 to 3 days if no improvement. Please go directly to the Emergency Department immediately should you begin to have any of the following symptoms: persistent fevers, increased pain or persistent nausea/vomiting.  Your blood pressure was elevated at today's visit please take your medication as directed as soon as possible. Monitor BP daily preferably at the same time each day. If readings persistently >140/90, follow up with our office or your PCP sooner. If chest pain, shortness of breath, headache, numbness/tingling, slurred speech, or facial drooping develop, go directly to the ER.

## 2022-12-04 NOTE — ED Provider Notes (Signed)
[facility]  Note:  This document was prepared using Dragon voice recognition software and may include unintentional dictation errors.  MRN: [id number] DOB: [birth year] DATE: 12/04/22   Subjective:  Chief Complaint:  Chief Complaint  Patient presents with   Nausea    Lower back pain, freq. Urination - Entered by patient     HPI: Jenny Willis is a 57 y.o. female presenting for dysuria and low back pain intermittently for the past 2 weeks with symptoms becoming worse this morning. Patient states she has a history of frequent UTIs and these symptoms are typical for her when she has UTI.  She reports frequent urination as well.  She does have history of diabetes and per her chart last A1c was 10.4.  She states her blood glucose is averaging 145-160 lately.  Denies fever, vomiting, abdominal pain, vaginal discharge, vaginal odor. Endorses nausea, low back pain, dysuria, increased urinary frequency. Presents NAD.  Prior to Admission medications   Medication Sig Start Date End Date Taking? Authorizing Provider  albuterol (VENTOLIN HFA) 108 (90 Base) MCG/ACT inhaler INHALE 2 PUFFS INTO THE LUNGS EVERY 6 HOURS AS NEEDED FOR WHEEZING OR SHORTNESS OF BREATH 09/24/22   Gabriel Earing, FNP  amLODipine (NORVASC) 2.5 MG tablet Take by mouth. 10/11/20   [provider]  aspirin 81 MG chewable tablet Chew by mouth.    [provider]  butalbital-acetaminophen-caffeine (FIORICET) 50-325-40 MG tablet Take 1 tablet by mouth 2 (two) times daily. 12/08/21   [provider]  cetirizine (ZYRTEC) 10 MG tablet Take by mouth.    [provider]  cyclobenzaprine (FLEXERIL) 10 MG tablet Take 1 tablet (10 mg total) by mouth 3 (three) times daily as needed for muscle spasms. 03/27/22   Junie Spencer, FNP  diclofenac (VOLTAREN) 75 MG EC tablet TAKE 1 TABLET BY MOUTH TWICE A DAY 12/01/22   Sonny Masters, FNP  fluticasone Aleda Grana) 50 MCG/ACT nasal spray Place two sprays in  each nostril once daily 10/07/13   Lattie Haw, MD  hydrochlorothiazide (MICROZIDE) 12.5 MG capsule Take by mouth. 05/21/20   [provider]  levothyroxine (SYNTHROID) 50 MCG tablet TAKE 1 TABLET BY MOUTH DAILY BEFORE BREAKFAST 12/02/22   Sonny Masters, FNP  losartan (COZAAR) 50 MG tablet Take 1 tablet (50 mg total) by mouth daily. 06/10/22   Sonny Masters, FNP  MELATONIN PO Take by mouth.    [provider]  metoprolol succinate (TOPROL-XL) 100 MG 24 hr tablet Take 1 tablet (100 mg total) by mouth daily. Take with or immediately following a meal. 07/22/22   Rollene Rotunda, MD  NON [facility] OmegaCo3    [provider]  Clent Demark Liqua-D (K-87)    [provider]  OVER THE COUNTER MEDICATION Thyroid support    [provider]  pregabalin (LYRICA) 25 MG capsule Take 25 mg by mouth 2 (two) times daily.    [provider]  rosuvastatin (CRESTOR) 20 MG tablet Take by mouth. 09/17/21   [provider]  sitaGLIPtin (JANUVIA) 50 MG tablet Take 1 tablet (50 mg total) by mouth daily. 06/10/22   Sonny Masters, FNP  SUMAtriptan (IMITREX) 100 MG tablet TAKE 1 TABLET AT ONSET OF MIGRAINE. REPEAT ONLY IF NEEDED IN 2 HRS. NO MORE THAN 2 IN 24 HRS 12/04/22   Rakes, Doralee Albino, FNP  tirzepatide [facility]) 5 MG/0.5ML Pen Inject 5 mg into the skin once a week. 09/15/22   Sonny Masters, FNP  Allergies  Allergen Reactions   Gabapentin Shortness Of Breath   Sulfa Antibiotics Hives    REACTION: GI upset   Nitrofurantoin Macrocrystal Other (See Comments)    Muscle spasms    Codeine    Metformin     REACTION: diarrhea   Penicillins     REACTION: childhood reaction wtih Scarlett Fever   Sulfonamide Derivatives     REACTION: GI upset   Lisinopril Other (See Comments)    History:   Past Medical History:  Diagnosis Date   Diabetes mellitus without complication    Hyperlipidemia    Hypertension    Thyroid disease      Past Surgical  History:  Procedure Laterality Date   ABDOMINAL HYSTERECTOMY     BREAST BIOPSY Left    CYSTECTOMY Left     Family History  Problem Relation Age of Onset   COPD Mother    Cancer Mother    COPD Father    Cancer Father    Breast cancer Cousin    Breast cancer Cousin    Breast cancer Cousin    Breast cancer Cousin    Cancer Other    Hypertension Other    Diabetes Other    Hyperlipidemia Other     Social History   Tobacco Use   Smoking status: Former   Smokeless tobacco: Never  Substance Use Topics   Alcohol use: No   Drug use: No    Review of Systems  Constitutional:  Positive for fatigue. Negative for fever.  Gastrointestinal:  Positive for nausea. Negative for abdominal pain and vomiting.  Genitourinary:  Positive for dysuria, frequency and urgency. Negative for flank pain and vaginal discharge.  Musculoskeletal:  Positive for back pain.     Objective:   Vitals: BP (!) 172/104 (BP Location: Right Arm)   Pulse 86   Temp 97.9 F (36.6 C) (Oral)   Resp 16   SpO2 95%   Physical Exam Constitutional:      General: She is not in acute distress.    Appearance: Normal appearance. She is well-developed. She is obese. She is not ill-appearing or toxic-appearing.  HENT:     Head: Normocephalic and atraumatic.  Cardiovascular:     Rate and Rhythm: Normal rate and regular rhythm.     Heart sounds: Normal heart sounds.  Pulmonary:     Effort: Pulmonary effort is normal.     Breath sounds: Normal breath sounds.     Comments: Clear to auscultation bilaterally  Abdominal:     General: Bowel sounds are normal.     Palpations: Abdomen is soft.     Tenderness: There is no abdominal tenderness. There is no right CVA tenderness or left CVA tenderness.  Skin:    General: Skin is warm and dry.  Neurological:     General: No focal deficit present.     Mental Status: She is alert.  Psychiatric:        Mood and Affect: Mood and affect normal.     Results:   Labs: Results for orders placed or performed during the hospital encounter of 12/04/22 (from the past 24 hour(s))  POCT urinalysis dipstick     Status: Abnormal   Collection Time: 12/04/22  9:38 AM  Result Value Ref Range   Color, UA yellow yellow   Clarity, UA clear clear   Glucose, UA negative negative mg/dL   Bilirubin, UA small (A) negative   Ketones, POC UA negative negative mg/dL   Spec Grav,  UA >=1.030 (A) 1.010 - 1.025   Blood, UA negative negative   pH, UA 5.5 5.0 - 8.0   Protein Ur, POC =30 (A) negative mg/dL   Urobilinogen, UA 0.2 0.2 or 1.0 E.U./dL   Nitrite, UA Negative Negative   Leukocytes, UA Negative Negative    Radiology: No results found.   UC Course/Treatments:  Procedures: Procedures   Medications Ordered in UC: Medications - No data to display   Assessment and Plan :     ICD-10-CM   1. Dysuria  R30.0 Urine Culture    Urine Culture    2. Essential hypertension  I10      Dysuria:  Afebrile, nontoxic-appearing, NAD. VSS. DDX includes but not limited to: UTI, bacterial vaginosis, infection, pyelonephritis, secondary to hyperglycemia UA was unremarkable today in office.  Urine culture is pending.  Recommended patient have cytology swab done today in office, but she refused.  Given patient has had similar symptoms with UTIs in the past, will treat empirically with Cipro 500 mg twice daily while waiting for culture to return.  If culture is negative, she can stop ABX at that time.  Recommend follow-up with PCP if no improvement. Strict ED precautions were given and patient verbalized understanding.  Essential hypertension: Afebrile, nontoxic-appearing, NAD. VSS. Current, Uncontrolled Asymptomatic  Patient states she has not taken her blood pressure medication today.  Recommended she have a recheck today in the office, but patient refused saying it was not going to get any better.  Patient advised to take her losartan 50 mg daily as well as metoprolol  100 mg daily as directed by her PCP.  She was instructed to monitor her BP daily and follow-up with PCP if blood pressure continues to remain elevated.  Strict ED precautions were given and patient verbalized understanding.    ED Discharge Orders          Ordered    ciprofloxacin (CIPRO) 500 MG tablet  2 times daily        12/04/22 0953             PDMP not reviewed this encounter.     Herbie Lehrmann P, PA-C 12/04/22 1024

## 2022-12-04 NOTE — ED Triage Notes (Signed)
Patient presents for several weeks of dysuria, has been taking AZO to help.  Patient states last night she has burning with urination, and this morning nausea and left sided lower back pain.  Patient states this is all very typical of her UTIs.

## 2022-12-05 ENCOUNTER — Other Ambulatory Visit: Payer: Self-pay | Admitting: Family Medicine

## 2022-12-05 DIAGNOSIS — R0602 Shortness of breath: Secondary | ICD-10-CM

## 2022-12-07 LAB — URINE CULTURE: Culture: >=100000 — AB

## 2022-12-09 ENCOUNTER — Telehealth: Payer: Self-pay | Admitting: Family Medicine

## 2022-12-09 NOTE — Telephone Encounter (Signed)
  Prescription Request  12/09/2022  Is this a "Controlled Substance" medicine? tirzepatide [facility]) 5 MG/0.5ML Pen   Have you seen your PCP in the last 2 weeks? Pt 12/15/2022  If YES, route message to pool  -  If NO, patient needs to be scheduled for appointment.  What is the name of the medication or equipment? tirzepatide Greggory Keen) 5 MG/0.5ML Pen   Have you contacted your pharmacy to request a refill?  Yes, but no where in stock for . Pt said she found the rx in 2.5MG  at [facility] in [place]. PT says that pharmacy cannot double the rx it has to be called in 2.5MG  just for one month supply. Pt says that she will talk to [facility] about this issues at her apt next week. Pt says that she has been out of rx for 2 weeks.  Patient notified that their request is being sent to the clinical staff for review and that they should receive a response within 2 business days.

## 2022-12-09 NOTE — Telephone Encounter (Signed)
Attempted to contact - NA 

## 2022-12-09 NOTE — Telephone Encounter (Signed)
Please advise 

## 2022-12-11 ENCOUNTER — Other Ambulatory Visit: Payer: Self-pay | Admitting: Family Medicine

## 2022-12-11 DIAGNOSIS — E1169 Type 2 diabetes mellitus with other specified complication: Secondary | ICD-10-CM

## 2022-12-14 NOTE — Telephone Encounter (Signed)
Patient states she is having no side effects and she will talk to [place] about it at her appointment tomorrow.

## 2022-12-15 ENCOUNTER — Encounter: Payer: Self-pay | Admitting: Family Medicine

## 2022-12-15 ENCOUNTER — Ambulatory Visit (INDEPENDENT_AMBULATORY_CARE_PROVIDER_SITE_OTHER): Payer: No Typology Code available for payment source | Admitting: Family Medicine

## 2022-12-15 ENCOUNTER — Other Ambulatory Visit (HOSPITAL_COMMUNITY): Payer: Self-pay

## 2022-12-15 VITALS — BP 152/89 | HR 83 | Temp 97.9°F | Ht 67.0 in | Wt 211.4 lb

## 2022-12-15 DIAGNOSIS — R067 Sneezing: Secondary | ICD-10-CM

## 2022-12-15 DIAGNOSIS — E785 Hyperlipidemia, unspecified: Secondary | ICD-10-CM

## 2022-12-15 DIAGNOSIS — E1159 Type 2 diabetes mellitus with other circulatory complications: Secondary | ICD-10-CM | POA: Diagnosis not present

## 2022-12-15 DIAGNOSIS — E559 Vitamin D deficiency, unspecified: Secondary | ICD-10-CM

## 2022-12-15 DIAGNOSIS — E039 Hypothyroidism, unspecified: Secondary | ICD-10-CM | POA: Diagnosis not present

## 2022-12-15 DIAGNOSIS — R232 Flushing: Secondary | ICD-10-CM

## 2022-12-15 DIAGNOSIS — I152 Hypertension secondary to endocrine disorders: Secondary | ICD-10-CM

## 2022-12-15 DIAGNOSIS — E1169 Type 2 diabetes mellitus with other specified complication: Secondary | ICD-10-CM

## 2022-12-15 LAB — BAYER DCA HB A1C WAIVED: HB A1C (BAYER DCA - WAIVED): 7.3 % — ABNORMAL HIGH (ref 4.8–5.6)

## 2022-12-15 MED ORDER — TIRZEPATIDE 7.5 MG/0.5ML ~~LOC~~ SOAJ
7.5000 mg | SUBCUTANEOUS | 3 refills | Status: DC
Start: 2022-12-15 — End: 2023-03-17
  Filled 2022-12-15 – 2023-03-02 (×2): qty 2, 28d supply, fill #0

## 2022-12-15 MED ORDER — AMLODIPINE BESYLATE 5 MG PO TABS
5.0000 mg | ORAL_TABLET | Freq: Every day | ORAL | 1 refills | Status: DC
Start: 2022-12-15 — End: 2023-07-07

## 2022-12-15 NOTE — Progress Notes (Signed)
Subjective:  Patient ID: Jenny Willis, female    DOB: March 10, 1966, 57 y.o.   MRN: [id number]  Patient Care Team: Sonny Masters, FNP as PCP - General (Family Medicine)   Chief Complaint:  Medical Management of Chronic Issues (3 month chronic follow up ) and Hot Flashes (Ongoing on and off but have gotten worse )   HPI: Jenny Willis is a 57 y.o. female presenting on 12/15/2022 for Medical Management of Chronic Issues (3 month chronic follow up ) and Hot Flashes (Ongoing on and off but have gotten worse )    1.Diabetes Mellitus Type II, Follow-up  Lab Results  Component Value Date   HGBA1C 10.1 (H) 09/15/2022   HGBA1C 10.5 (H) 06/10/2022   HGBA1C 10.8 (H) 01/09/2022   Wt Readings from Last 3 Encounters:  12/15/22 211 lb 6.4 oz (95.9 kg)  09/15/22 216 lb 9.6 oz (98.2 kg)  07/22/22 221 lb 6.4 oz (100.4 kg)   Last seen for diabetes 3 months ago.  Management since then includes Mounjaro and Januvia. She reports excellent compliance with treatment. She is not having side effects.  Symptoms: No fatigue No foot ulcerations  No appetite changes No nausea  Yes paresthesia of the feet  No polydipsia  No polyuria No visual disturbances   No vomiting     Home blood sugar records: trend: stable  Episodes of hypoglycemia? No   Most Recent Eye Exam: 06/2022 Current exercise: none Current diet habits: in general, a "healthy" diet    Pertinent Labs: Lab Results  Component Value Date   CHOL 121 01/09/2022   HDL 45 01/09/2022   LDLCALC 53 01/09/2022   TRIG 132 01/09/2022   CHOLHDL 2.7 01/09/2022   Lab Results  Component Value Date   NA 137 09/15/2022   K 4.3 09/15/2022   CREATININE 0.76 09/15/2022   EGFR 90 09/15/2022   LABMICR 25.0 01/09/2022   MICRALBCREAT 12 01/09/2022     ---------------------------------------------------------------------------------------------------  2.Hypertension, follow-up  BP Readings from Last 3 Encounters:  12/15/22 (!) 152/89   12/04/22 (!) 172/104  09/15/22 (!) 138/91   Wt Readings from Last 3 Encounters:  12/15/22 211 lb 6.4 oz (95.9 kg)  09/15/22 216 lb 9.6 oz (98.2 kg)  07/22/22 221 lb 6.4 oz (100.4 kg)     She was last seen for hypertension 3 months ago.  BP at that visit was 172/104. Management since that visit includes metoprolol, norvasc, HCTZ, losartan.  She reports poor compliance with treatment. She is having side effects. Leg pain with losartan. Stopped all medications except for metoprolol She is following a Regular diet. She is not exercising. She does not smoke.  Use of agents associated with hypertension: thyroid hormones.   Symptoms: No chest pain No chest pressure  No palpitations No syncope  No dyspnea No orthopnea  No paroxysmal nocturnal dyspnea No lower extremity edema   Pertinent labs Lab Results  Component Value Date   CHOL 121 01/09/2022   HDL 45 01/09/2022   LDLCALC 53 01/09/2022   TRIG 132 01/09/2022   CHOLHDL 2.7 01/09/2022   Lab Results  Component Value Date   NA 137 09/15/2022   K 4.3 09/15/2022   CREATININE 0.76 09/15/2022   EGFR 90 09/15/2022   GLUCOSE 193 (H) 09/15/2022   TSH 4.110 06/10/2022     The ASCVD Risk score (Arnett DK, et al., 2019) failed to calculate for the following reasons:   The valid total cholesterol range is 130  to 320 mg/dL  ---------------------------------------------------------------------------------------------------   3.Lipid/Cholesterol, Follow-up  Last lipid panel Other pertinent labs  Lab Results  Component Value Date   CHOL 121 01/09/2022   HDL 45 01/09/2022   LDLCALC 53 01/09/2022   TRIG 132 01/09/2022   CHOLHDL 2.7 01/09/2022   Lab Results  Component Value Date   ALT 21 09/15/2022   AST 10 09/15/2022   PLT 282 01/09/2022   TSH 4.110 06/10/2022     She was last seen for this 3 months ago.  Management since that visit includes crestor.  She reports poor compliance with treatment. She is not having side  effects because she stopped taking the medications.  Symptoms: No chest pain No chest pressure/discomfort  No dyspnea No lower extremity edema  Yes numbness or tingling of extremity No orthopnea  No palpitations No paroxysmal nocturnal dyspnea  No speech difficulty No syncope   Current diet: in general, a "healthy" diet   Current exercise: none  The ASCVD Risk score (Arnett DK, et al., 2019) failed to calculate for the following reasons:   The valid total cholesterol range is 130 to 320 mg/dL  ---------------------------------------------------------------------------------------------------   4. Acquired hypothyroidism Compliant with medications - No Current medications - stopped levothyroxine and started over the counter medications Adverse side effects - No Weight - stable  Bowel habit changes - No Heat or cold intolerance - No Mood changes - No Changes in sleep habits - No Fatigue - No Skin, hair, or nail changes - No Tremor - No Palpitations - No Edema - No Shortness of breath - No   5.Vitamin D deficiency, follow-up  Lab Results  Component Value Date   VD25OH 39.1 01/09/2022   CALCIUM 9.3 09/15/2022   CALCIUM 9.5 06/10/2022        Wt Readings from Last 3 Encounters:  12/15/22 211 lb 6.4 oz (95.9 kg)  09/15/22 216 lb 9.6 oz (98.2 kg)  07/22/22 221 lb 6.4 oz (100.4 kg)    She was last seen for vitamin D deficiency 3 months ago.  Management since that visit includes Vit D repletion. She reports excellent compliance with treatment. She is not having side effects.   Symptoms: No change in energy level Yes numbness or tingling  No bone pain No unexplained fracture   ---------------------------------------------------------------------------------------------------  6. Hot flashes She reports worsening hot flashes over the last several months. Had a hysterectomy at age 37.   30. Sneeze States she sneezes every time she eats, ongoing since moving to [place].  Would like to see ENT. No other associated symptoms.      Relevant past medical, surgical, family, and social history reviewed and updated as indicated.  Allergies and medications reviewed and updated. Data reviewed: Chart in Epic.   Past Medical History:  Diagnosis Date   Diabetes mellitus without complication (HCC)    Hyperlipidemia    Hypertension    Thyroid disease     Past Surgical History:  Procedure Laterality Date   ABDOMINAL HYSTERECTOMY     BREAST BIOPSY Left    CYSTECTOMY Left     Social History   Socioeconomic History   Marital status: Married    Spouse name: Not on file   Number of children: Not on file   Years of education: Not on file   Highest education level: Not on file  Occupational History   Not on file  Tobacco Use   Smoking status: Former   Smokeless tobacco: Never  Substance and Sexual Activity  Alcohol use: No   Drug use: No   Sexual activity: Yes  Other Topics Concern   Not on file  Social History Narrative   Not on file   Social Determinants of Health   Financial Resource Strain: Not on file  Food Insecurity: Not on file  Transportation Needs: Not on file  Physical Activity: Not on file  Stress: Not on file  Social Connections: Not on file  Intimate Partner Violence: Not on file    Outpatient Encounter Medications as of 12/15/2022  Medication Sig   albuterol (VENTOLIN HFA) 108 (90 Base) MCG/ACT inhaler INHALE 2 PUFFS INTO THE LUNGS EVERY 6 HOURS AS NEEDED FOR WHEEZING OR SHORTNESS OF BREATH   amLODipine (NORVASC) 5 MG tablet Take 1 tablet (5 mg total) by mouth daily.   aspirin 81 MG chewable tablet Chew by mouth.   butalbital-acetaminophen-caffeine (FIORICET) 50-325-40 MG tablet Take 1 tablet by mouth 2 (two) times daily.   cetirizine (ZYRTEC) 10 MG tablet Take by mouth.   cyclobenzaprine (FLEXERIL) 10 MG tablet Take 1 tablet (10 mg total) by mouth 3 (three) times daily as needed for muscle spasms.   fluticasone (FLONASE) 50  MCG/ACT nasal spray Place two sprays in each nostril once daily   MELATONIN PO Take by mouth.   metoprolol succinate (TOPROL-XL) 100 MG 24 hr tablet Take 1 tablet (100 mg total) by mouth daily. Take with or immediately following a meal.   NON FORMULARY OmegaCo3   NON FORMULARY Liqua-D (K-87)   OVER THE COUNTER MEDICATION Thyroid support   OVER THE COUNTER MEDICATION Thyroid support   pregabalin (LYRICA) 25 MG capsule Take 25 mg by mouth 2 (two) times daily.   sitaGLIPtin (JANUVIA) 50 MG tablet TAKE 1 TABLET BY MOUTH EVERY DAY   SUMAtriptan (IMITREX) 100 MG tablet TAKE 1 TABLET AT ONSET OF MIGRAINE. REPEAT ONLY IF NEEDED IN 2 HRS. NO MORE THAN 2 IN 24 HRS   tirzepatide (MOUNJARO) 7.5 MG/0.5ML Pen Inject 7.5 mg into the skin once a week.   [DISCONTINUED] amLODipine (NORVASC) 2.5 MG tablet Take by mouth.   [DISCONTINUED] diclofenac (VOLTAREN) 75 MG EC tablet TAKE 1 TABLET BY MOUTH TWICE A DAY   [DISCONTINUED] hydrochlorothiazide (MICROZIDE) 12.5 MG capsule Take by mouth.   [DISCONTINUED] tirzepatide [facility]) 5 MG/0.5ML Pen Inject 5 mg into the skin once a week.   rosuvastatin (CRESTOR) 20 MG tablet Take by mouth. (Patient not taking: Reported on 12/15/2022)   [DISCONTINUED] levothyroxine (SYNTHROID) 50 MCG tablet TAKE 1 TABLET BY MOUTH DAILY BEFORE BREAKFAST   [DISCONTINUED] losartan (COZAAR) 50 MG tablet Take 1 tablet (50 mg total) by mouth daily. (Patient not taking: Reported on 12/15/2022)   No facility-administered encounter medications on file as of 12/15/2022.    Allergies  Allergen Reactions   Gabapentin Shortness Of Breath   Sulfa Antibiotics Hives    REACTION: GI upset   Nitrofurantoin Macrocrystal Other (See Comments)    Muscle spasms    Codeine    Metformin     REACTION: diarrhea   Penicillins     REACTION: childhood reaction wtih Scarlett Fever   Sulfonamide Derivatives     REACTION: GI upset   Lisinopril Other (See Comments)    Review of Systems  Constitutional:   Negative for activity change, appetite change, chills, diaphoresis, fatigue, fever and unexpected weight change.  HENT:  Positive for sneezing.   Eyes: Negative.  Negative for photophobia and visual disturbance.  Respiratory:  Negative for cough, chest tightness and shortness  of breath.   Cardiovascular:  Negative for chest pain, palpitations and leg swelling.  Gastrointestinal:  Negative for blood in stool, constipation, diarrhea, nausea and vomiting.  Endocrine: Negative.  Negative for cold intolerance, heat intolerance, polydipsia, polyphagia and polyuria.  Genitourinary:  Negative for decreased urine volume, difficulty urinating, dysuria, frequency and urgency.  Musculoskeletal:  Positive for myalgias. Negative for arthralgias.  Skin: Negative.   Allergic/Immunologic: Negative.   Neurological:  Negative for dizziness, tremors, seizures, syncope, facial asymmetry, speech difficulty, weakness, light-headedness, numbness and headaches.  Hematological: Negative.   Psychiatric/Behavioral:  Negative for confusion, hallucinations, sleep disturbance and suicidal ideas.   All other systems reviewed and are negative.       Objective:  BP (!) 152/89   Pulse 83   Temp 97.9 F (36.6 C) (Temporal)   Ht 5\' 7"  (1.702 m)   Wt 211 lb 6.4 oz (95.9 kg)   SpO2 96%   BMI 33.11 kg/m    Wt Readings from Last 3 Encounters:  12/15/22 211 lb 6.4 oz (95.9 kg)  09/15/22 216 lb 9.6 oz (98.2 kg)  07/22/22 221 lb 6.4 oz (100.4 kg)    Physical Exam Vitals and nursing note reviewed.  Constitutional:      General: She is not in acute distress.    Appearance: Normal appearance. She is well-developed and well-groomed. She is obese. She is not ill-appearing, toxic-appearing or diaphoretic.  HENT:     Head: Normocephalic and atraumatic.     Jaw: There is normal jaw occlusion.     Right Ear: Hearing normal.     Left Ear: Hearing normal.     Nose: Nose normal.     Mouth/Throat:     Lips: Pink.     Mouth:  Mucous membranes are moist.     Pharynx: Uvula midline.  Eyes:     General: Lids are normal.     Conjunctiva/sclera: Conjunctivae normal.     Pupils: Pupils are equal, round, and reactive to light.  Neck:     Thyroid: No thyroid mass, thyromegaly or thyroid tenderness.     Vascular: No carotid bruit or JVD.     Trachea: Trachea and phonation normal.  Cardiovascular:     Rate and Rhythm: Normal rate and regular rhythm.     Chest Wall: PMI is not displaced.     Pulses: Normal pulses.     Heart sounds: Normal heart sounds. No murmur heard.    No friction rub. No gallop.  Pulmonary:     Effort: Pulmonary effort is normal. No respiratory distress.     Breath sounds: Normal breath sounds. No wheezing.  Abdominal:     General: There is no abdominal bruit.     Palpations: There is no hepatomegaly or splenomegaly.  Musculoskeletal:        General: Normal range of motion.     Cervical back: Normal range of motion and neck supple.     Right lower leg: No edema.     Left lower leg: No edema.  Lymphadenopathy:     Cervical: No cervical adenopathy.  Skin:    General: Skin is warm and dry.     Capillary Refill: Capillary refill takes less than 2 seconds.     Coloration: Skin is not cyanotic, jaundiced or pale.     Findings: No rash.  Neurological:     General: No focal deficit present.     Mental Status: She is alert and oriented to person, place, and time.  Sensory: Sensation is intact.     Motor: Motor function is intact.     Coordination: Coordination is intact.     Gait: Gait is intact.     Deep Tendon Reflexes: Reflexes are normal and symmetric.  Psychiatric:        Attention and Perception: Attention and perception normal.        Mood and Affect: Mood and affect normal.        Speech: Speech normal.        Behavior: Behavior normal. Behavior is cooperative.        Thought Content: Thought content normal.        Cognition and Memory: Cognition and memory normal.         Judgment: Judgment normal.     Results for orders placed or performed during the hospital encounter of 12/04/22  Urine Culture   Specimen: Urine, Clean Catch  Result Value Ref Range   Specimen Description URINE, CLEAN CATCH    Special Requests      NONE Performed at [facility], [address], [place], [place] [address]    Culture >=100,000 COLONIES/mL ENTEROBACTER CLOACAE (A)    Report Status 12/07/2022 FINAL    Organism ID, Bacteria ENTEROBACTER CLOACAE (A)       Susceptibility   Enterobacter cloacae - MIC*    CEFEPIME <=0.12 SENSITIVE Sensitive     CIPROFLOXACIN <=0.25 SENSITIVE Sensitive     GENTAMICIN <=1 SENSITIVE Sensitive     IMIPENEM <=0.25 SENSITIVE Sensitive     NITROFURANTOIN 64 INTERMEDIATE Intermediate     TRIMETH/SULFA <=20 SENSITIVE Sensitive     PIP/TAZO <=4 SENSITIVE Sensitive     * >=100,000 COLONIES/mL ENTEROBACTER CLOACAE  POCT urinalysis dipstick  Result Value Ref Range   Color, UA yellow yellow   Clarity, UA clear clear   Glucose, UA negative negative mg/dL   Bilirubin, UA small (A) negative   Ketones, POC UA negative negative mg/dL   Spec Grav, UA >=6.045 (A) 1.010 - 1.025   Blood, UA negative negative   pH, UA 5.5 5.0 - 8.0   Protein Ur, POC =30 (A) negative mg/dL   Urobilinogen, UA 0.2 0.2 or 1.0 E.U./dL   Nitrite, UA Negative Negative   Leukocytes, UA Negative Negative       Pertinent labs & imaging results that were available during my care of the patient were reviewed by me and considered in my medical decision making.  Assessment & Plan:  Janda was seen today for medical management of chronic issues and hot flashes.  Diagnoses and all orders for this visit:  Type 2 diabetes mellitus with other specified complication, without long-term current use of insulin (HCC) A1C has decreased to 7.3, great job. Will increase Mounjaro dosing to 7.5 mg weekly. Diet and exercise encouraged.  -     Bayer DCA Hb A1c Waived -     tirzepatide  (MOUNJARO) 7.5 MG/0.5ML Pen; Inject 7.5 mg into the skin once a week.  Hypertension associated with diabetes (HCC) Pt stopped her ARB due to leg pain. Unable to tolerate ACEi. Will add below as she has been on in the past. Will consider Farxiga for renal protection.  -     CBC with Differential/Platelet -     CMP14+EGFR -     amLODipine (NORVASC) 5 MG tablet; Take 1 tablet (5 mg total) by mouth daily.  Hyperlipidemia associated with type 2 diabetes mellitus (HCC) Stopped statin therapy and does not wish to  restart. Will repeat labs today. Diet and exercise encouraged.  -     Lipid panel  Acquired hypothyroidism Thyroid disease has been well controlled. Labs are pending. Adjustments to regimen will be made if warranted. Make sure to take medications on an empty stomach with a full glass of water. Make sure to avoid vitamins or supplements for at least 4 hours before and 4 hours after taking medications. Repeat labs in 3 months if adjustments are made and in 6 months if stable.   -     Thyroid Panel With TSH -     Hormone Panel  Vitamin D deficiency Labs pending. Continue repletion therapy. If indicated, will change repletion dosage. Eat foods rich in Vit D including milk, orange juice, yogurt with vitamin D added, salmon or mackerel, canned tuna fish, cereals with vitamin D added, and cod liver oil. Get out in the sun but make sure to wear at least SPF 30 sunscreen.  -     VITAMIN D 25 Hydroxy (Vit-D Deficiency, Fractures)  Hot flashes Will check hormone panel.  -     Hormone Panel  Sneeze Sneezing every time she eats. Would like to see ENT, referral placed.  -     Ambulatory referral to ENT     Continue all other maintenance medications.  Follow up plan: Return in about 3 months (around 03/16/2023), or if symptoms worsen or fail to improve, for DM.   Continue healthy lifestyle choices, including diet (rich in fruits, vegetables, and lean proteins, and low in salt and simple  carbohydrates) and exercise (at least 30 minutes of moderate physical activity daily).  Educational handout given for DM  The above assessment and management plan was discussed with the patient. The patient verbalized understanding of and has agreed to the management plan. Patient is aware to call the clinic if they develop any new symptoms or if symptoms persist or worsen. Patient is aware when to return to the clinic for a follow-up visit. Patient educated on when it is appropriate to go to the emergency department.   Kari Baars, FNP-C [facility] [phone]

## 2022-12-15 NOTE — Patient Instructions (Signed)

## 2022-12-15 NOTE — Addendum Note (Signed)
Addended by: Sonny Masters on: 12/15/2022 02:54 PM   Modules accepted: Level of Service

## 2022-12-16 LAB — VITAMIN D 25 HYDROXY (VIT D DEFICIENCY, FRACTURES): Vit D, 25-Hydroxy: 35.3 ng/mL (ref 30.0–100.0)

## 2022-12-16 LAB — CBC WITH DIFFERENTIAL/PLATELET
Basophils Absolute: 0.1 10*3/uL (ref 0.0–0.2)
Basos: 1 %
EOS (ABSOLUTE): 0.1 10*3/uL (ref 0.0–0.4)
Eos: 2 %
Hematocrit: 43.1 % (ref 34.0–46.6)
Hemoglobin: 14.1 g/dL (ref 11.1–15.9)
Immature Grans (Abs): 0 10*3/uL (ref 0.0–0.1)
Immature Granulocytes: 0 %
Lymphocytes Absolute: 2.4 10*3/uL (ref 0.7–3.1)
Lymphs: 34 %
MCH: 28.1 pg (ref 26.6–33.0)
MCHC: 32.7 g/dL (ref 31.5–35.7)
MCV: 86 fL (ref 79–97)
Monocytes Absolute: 0.5 10*3/uL (ref 0.1–0.9)
Monocytes: 8 %
Neutrophils Absolute: 3.8 10*3/uL (ref 1.4–7.0)
Neutrophils: 55 %
Platelets: 293 10*3/uL (ref 150–450)
RBC: 5.02 x10E6/uL (ref 3.77–5.28)
RDW: 12.2 % (ref 11.7–15.4)
WBC: 6.9 10*3/uL (ref 3.4–10.8)

## 2022-12-16 LAB — CMP14+EGFR
ALT: 13 IU/L (ref 0–32)
AST: 13 IU/L (ref 0–40)
Albumin/Globulin Ratio: 1.5 (ref 1.2–2.2)
Albumin: 4.2 g/dL (ref 3.8–4.9)
Alkaline Phosphatase: 94 IU/L (ref 44–121)
BUN/Creatinine Ratio: 23 (ref 9–23)
BUN: 15 mg/dL (ref 6–24)
Bilirubin Total: 0.4 mg/dL (ref 0.0–1.2)
CO2: 19 mmol/L — ABNORMAL LOW (ref 20–29)
Calcium: 9.5 mg/dL (ref 8.7–10.2)
Chloride: 102 mmol/L (ref 96–106)
Creatinine, Ser: 0.64 mg/dL (ref 0.57–1.00)
Globulin, Total: 2.8 g/dL (ref 1.5–4.5)
Glucose: 154 mg/dL — ABNORMAL HIGH (ref 70–99)
Potassium: 4.4 mmol/L (ref 3.5–5.2)
Sodium: 138 mmol/L (ref 134–144)
Total Protein: 7 g/dL (ref 6.0–8.5)
eGFR: 102 mL/min/{1.73_m2} (ref >59)

## 2022-12-16 LAB — LIPID PANEL
Chol/HDL Ratio: 6.6 ratio — ABNORMAL HIGH (ref 0.0–4.4)
Cholesterol, Total: 244 mg/dL — ABNORMAL HIGH (ref 100–199)
HDL: 37 mg/dL — ABNORMAL LOW (ref >39)
LDL Chol Calc (NIH): 176 mg/dL — ABNORMAL HIGH (ref 0–99)
Triglycerides: 168 mg/dL — ABNORMAL HIGH (ref 0–149)
VLDL Cholesterol Cal: 31 mg/dL (ref 5–40)

## 2022-12-16 LAB — THYROID PANEL WITH TSH
Free Thyroxine Index: 2.1 (ref 1.2–4.9)
T3 Uptake Ratio: 23 % — ABNORMAL LOW (ref 24–39)
T4, Total: 9.2 ug/dL (ref 4.5–12.0)
TSH: 4.91 u[IU]/mL — ABNORMAL HIGH (ref 0.450–4.500)

## 2022-12-18 LAB — HORMONE PANEL (T4,TSH,FSH,TESTT,SHBG,DHEA,ETC): TSH: 5.1 uU/mL — ABNORMAL HIGH

## 2022-12-19 ENCOUNTER — Other Ambulatory Visit: Payer: Self-pay | Admitting: Family Medicine

## 2022-12-19 DIAGNOSIS — I152 Hypertension secondary to endocrine disorders: Secondary | ICD-10-CM

## 2022-12-24 LAB — HORMONE PANEL (T4,TSH,FSH,TESTT,SHBG,DHEA,ETC): Triiodothyronine (T-3), Serum: 143 ng/dL

## 2022-12-25 LAB — HORMONE PANEL (T4,TSH,FSH,TESTT,SHBG,DHEA,ETC)

## 2022-12-26 LAB — HORMONE PANEL (T4,TSH,FSH,TESTT,SHBG,DHEA,ETC)
Progesterone, Serum: <10 ng/dL
Sex Hormone Binding Globulin: 104 nmol/L

## 2022-12-28 LAB — HORMONE PANEL (T4,TSH,FSH,TESTT,SHBG,DHEA,ETC)
Follicle Stimulating Hormone: 36 m[IU]/mL
Free T-3: 3.4 pg/mL
T4: 7.6 ug/dL

## 2022-12-29 ENCOUNTER — Other Ambulatory Visit: Payer: Self-pay | Admitting: Family Medicine

## 2022-12-29 ENCOUNTER — Encounter: Payer: Self-pay | Admitting: Family Medicine

## 2022-12-29 DIAGNOSIS — M7582 Other shoulder lesions, left shoulder: Secondary | ICD-10-CM

## 2022-12-29 DIAGNOSIS — M25532 Pain in left wrist: Secondary | ICD-10-CM

## 2022-12-30 ENCOUNTER — Other Ambulatory Visit: Payer: Self-pay

## 2023-01-03 ENCOUNTER — Other Ambulatory Visit: Payer: Self-pay | Admitting: Family Medicine

## 2023-01-03 DIAGNOSIS — R0602 Shortness of breath: Secondary | ICD-10-CM

## 2023-01-13 ENCOUNTER — Other Ambulatory Visit (HOSPITAL_COMMUNITY): Payer: Self-pay

## 2023-01-15 ENCOUNTER — Encounter: Payer: Self-pay | Admitting: *Deleted

## 2023-02-28 ENCOUNTER — Other Ambulatory Visit: Payer: Self-pay | Admitting: Family Medicine

## 2023-02-28 DIAGNOSIS — E1169 Type 2 diabetes mellitus with other specified complication: Secondary | ICD-10-CM

## 2023-03-02 ENCOUNTER — Other Ambulatory Visit (HOSPITAL_BASED_OUTPATIENT_CLINIC_OR_DEPARTMENT_OTHER): Payer: Self-pay

## 2023-03-09 ENCOUNTER — Other Ambulatory Visit: Payer: Self-pay | Admitting: Family Medicine

## 2023-03-09 DIAGNOSIS — E1169 Type 2 diabetes mellitus with other specified complication: Secondary | ICD-10-CM

## 2023-03-16 ENCOUNTER — Ambulatory Visit: Payer: No Typology Code available for payment source | Admitting: Family Medicine

## 2023-03-17 ENCOUNTER — Ambulatory Visit (INDEPENDENT_AMBULATORY_CARE_PROVIDER_SITE_OTHER): Payer: No Typology Code available for payment source | Admitting: Family Medicine

## 2023-03-17 ENCOUNTER — Encounter: Payer: Self-pay | Admitting: Family Medicine

## 2023-03-17 VITALS — BP 138/94 | HR 88 | Temp 98.1°F | Ht 67.0 in | Wt 203.6 lb

## 2023-03-17 DIAGNOSIS — E1169 Type 2 diabetes mellitus with other specified complication: Secondary | ICD-10-CM

## 2023-03-17 DIAGNOSIS — E785 Hyperlipidemia, unspecified: Secondary | ICD-10-CM

## 2023-03-17 DIAGNOSIS — E039 Hypothyroidism, unspecified: Secondary | ICD-10-CM

## 2023-03-17 DIAGNOSIS — E1159 Type 2 diabetes mellitus with other circulatory complications: Secondary | ICD-10-CM | POA: Diagnosis not present

## 2023-03-17 DIAGNOSIS — Z7984 Long term (current) use of oral hypoglycemic drugs: Secondary | ICD-10-CM

## 2023-03-17 DIAGNOSIS — I152 Hypertension secondary to endocrine disorders: Secondary | ICD-10-CM

## 2023-03-17 LAB — THYROID PANEL WITH TSH

## 2023-03-17 LAB — LIPID PANEL
Chol/HDL Ratio: 5.5 ratio — ABNORMAL HIGH (ref 0.0–4.4)
Cholesterol, Total: 218 mg/dL — ABNORMAL HIGH (ref 100–199)
HDL: 40 mg/dL (ref >39)
LDL Chol Calc (NIH): 151 mg/dL — ABNORMAL HIGH (ref 0–99)
Triglycerides: 150 mg/dL — ABNORMAL HIGH (ref 0–149)
VLDL Cholesterol Cal: 27 mg/dL (ref 5–40)

## 2023-03-17 LAB — BAYER DCA HB A1C WAIVED: HB A1C (BAYER DCA - WAIVED): 6.9 % — ABNORMAL HIGH (ref 4.8–5.6)

## 2023-03-17 MED ORDER — TIRZEPATIDE 10 MG/0.5ML ~~LOC~~ SOAJ
10.0000 mg | SUBCUTANEOUS | 3 refills | Status: DC
Start: 2023-03-17 — End: 2023-07-08

## 2023-03-17 NOTE — Patient Instructions (Addendum)

## 2023-03-17 NOTE — Progress Notes (Signed)
Subjective:  Patient ID: Jenny Willis, female    DOB: 31-Jul-1966, 57 y.o.   MRN: [id number]  Patient Care Team: Sonny Masters, FNP as PCP - General (Family Medicine)   Chief Complaint:  Diabetes (3 month follow up )   HPI: Jenny Willis is a 57 y.o. female presenting on 03/17/2023 for Diabetes (3 month follow up )    1. Hypertension associated with diabetes (HCC) Complaint with meds - No, has not been taking amlodipine. Current Medications - amlodipine, metoprolol.  Checking BP at home ranging 130/80 Exercising Regularly - Yes Watching Salt intake - Yes Pertinent ROS:  Headache - No Fatigue - No Visual Disturbances - No Chest pain - No Dyspnea - No Palpitations - No LE edema - No They report good compliance with medications and can restate their regimen by memory. No medication side effects.  BP Readings from Last 3 Encounters:  03/17/23 (!) 138/94  12/15/22 (!) 152/89  12/04/22 (!) 172/104     2. Type 2 diabetes mellitus with other specified complication, without long-term current use of insulin (HCC) Currently on Mounjaro and Januvia. Tolerating well. Has had some constipation but just started increasing her fiber intake. No polyuria, polyphagia, or polydipsia. No visual changes. Has stopped binge eating and emotional eating. States she feels much better overall.   3. Hyperlipidemia associated with type 2 diabetes mellitus (HCC) Compliant with medications - Yes Current medications - crestor Side effects from medications - No Diet - healthy Exercise - active daily  4. Vitamin D deficiency Pt is taking oral repletion therapy. Denies bone pain and tenderness, muscle weakness, fracture, and difficulty walking. Lab Results  Component Value Date   VD25OH 35.3 12/15/2022   VD25OH 39.1 01/09/2022   Lab Results  Component Value Date   CALCIUM 9.5 12/15/2022      5. Acquired hypothyroidism Currently not taking medications and states she feels great. Was  taking a thyroid supplement over the counter but quit taking about 2 months ago. Denies hyper- or hypothyroid symptoms.       Relevant past medical, surgical, family, and social history reviewed and updated as indicated.  Allergies and medications reviewed and updated. Data reviewed: Chart in Epic.   Past Medical History:  Diagnosis Date   Diabetes mellitus without complication (HCC)    Hyperlipidemia    Hypertension    Thyroid disease     Past Surgical History:  Procedure Laterality Date   ABDOMINAL HYSTERECTOMY     BREAST BIOPSY Left    CYSTECTOMY Left     Social History   Socioeconomic History   Marital status: Married    Spouse name: Not on file   Number of children: Not on file   Years of education: Not on file   Highest education level: Not on file  Occupational History   Not on file  Tobacco Use   Smoking status: Former   Smokeless tobacco: Never  Substance and Sexual Activity   Alcohol use: No   Drug use: No   Sexual activity: Yes  Other Topics Concern   Not on file  Social History Narrative   Not on file   Social Determinants of Health   Financial Resource Strain: Low Risk  (10/01/2021)   Received from [facility]   Overall Financial Resource Strain (CARDIA)    Difficulty of Paying Living Expenses: Not hard at all  Food Insecurity: No Food Insecurity (10/01/2021)   Received from [facility]   Hunger  Vital Sign    Worried About Programme researcher, broadcasting/film/video in the Last Year: Never true    Ran Out of Food in the Last Year: Never true  Transportation Needs: No Transportation Needs (10/01/2021)   Received from [facility] - Transportation    Lack of Transportation (Medical): No    Lack of Transportation (Non-Medical): No  Physical Activity: Insufficiently Active (10/01/2021)   Received from [facility]   Exercise Vital Sign    Days of Exercise per Week: 3 days    Minutes of Exercise per Session: 10 min  Stress: No Stress Concern Present  (10/01/2021)   Received from [facility] - Occupational Stress Questionnaire    Feeling of Stress : Not at all  Social Connections: Unknown (12/15/2021)   Received from [facility]   Social Network    Social Network: Not on file  Intimate Partner Violence: Unknown (11/19/2021)   Received from [facility]   HITS    Physically Hurt: Not on file    Insult or Talk Down To: Not on file    Threaten Physical Harm: Not on file    Scream or Curse: Not on file    Outpatient Encounter Medications as of 03/17/2023  Medication Sig   albuterol (VENTOLIN HFA) 108 (90 Base) MCG/ACT inhaler INHALE 2 PUFFS INTO THE LUNGS EVERY 6 HOURS AS NEEDED FOR WHEEZING OR SHORTNESS OF BREATH   amLODipine (NORVASC) 5 MG tablet Take 1 tablet (5 mg total) by mouth daily.   aspirin 81 MG chewable tablet Chew by mouth.   butalbital-acetaminophen-caffeine (FIORICET) 50-325-40 MG tablet Take 1 tablet by mouth 2 (two) times daily.   cetirizine (ZYRTEC) 10 MG tablet Take by mouth.   cyclobenzaprine (FLEXERIL) 10 MG tablet Take 1 tablet (10 mg total) by mouth 3 (three) times daily as needed for muscle spasms.   fluticasone (FLONASE) 50 MCG/ACT nasal spray Place two sprays in each nostril once daily   JANUVIA 50 MG tablet TAKE 1 TABLET BY MOUTH EVERY DAY   MELATONIN PO Take by mouth.   metoprolol succinate (TOPROL-XL) 100 MG 24 hr tablet Take 1 tablet (100 mg total) by mouth daily. Take with or immediately following a meal.   NON FORMULARY OmegaCo3   NON FORMULARY Liqua-D (K-87)   OVER THE COUNTER MEDICATION Thyroid support   OVER THE COUNTER MEDICATION Thyroid support   pregabalin (LYRICA) 25 MG capsule Take 25 mg by mouth 2 (two) times daily.   rosuvastatin (CRESTOR) 20 MG tablet Take by mouth.   SUMAtriptan (IMITREX) 100 MG tablet TAKE 1 TABLET AT ONSET OF MIGRAINE. REPEAT ONLY IF NEEDED IN 2 HRS. NO MORE THAN 2 IN 24 HRS   tirzepatide (MOUNJARO) 10 MG/0.5ML Pen Inject 10 mg  into the skin once a week.   [DISCONTINUED] tirzepatide (MOUNJARO) 7.5 MG/0.5ML Pen Inject 7.5 mg into the skin once a week.   No facility-administered encounter medications on file as of 03/17/2023.    Allergies  Allergen Reactions   Gabapentin Shortness Of Breath   Sulfa Antibiotics Hives    REACTION: GI upset   Nitrofurantoin Macrocrystal Other (See Comments)    Muscle spasms    Codeine    Metformin     REACTION: diarrhea   Penicillins     REACTION: childhood reaction wtih Scarlett Fever   Sulfonamide Derivatives     REACTION: GI upset   Lisinopril Other (See Comments)    Review of Systems  Constitutional:  Negative for activity change, appetite change, chills, diaphoresis, fever and unexpected weight change.  HENT:  Positive for postnasal drip and rhinorrhea. Negative for congestion, dental problem, drooling, ear discharge, ear pain, facial swelling, hearing loss, mouth sores, nosebleeds, sinus pressure, sinus pain, sneezing, sore throat, tinnitus, trouble swallowing and voice change.   Eyes: Negative.  Negative for photophobia and visual disturbance.  Respiratory:  Positive for cough and shortness of breath (intermittent, not taking antihistamine). Negative for choking, chest tightness, wheezing and stridor.   Cardiovascular:  Negative for palpitations and leg swelling.  Gastrointestinal:  Positive for constipation. Negative for abdominal distention, abdominal pain, anal bleeding, blood in stool, diarrhea, nausea and vomiting.  Endocrine: Negative.   Genitourinary:  Negative for decreased urine volume, difficulty urinating, dysuria, frequency and urgency.  Musculoskeletal:  Negative for arthralgias and myalgias.  Skin: Negative.   Allergic/Immunologic: Negative.   Neurological:  Negative for syncope, facial asymmetry, light-headedness and numbness.  Hematological: Negative.   Psychiatric/Behavioral:  Negative for hallucinations, sleep disturbance and suicidal ideas.   All  other systems reviewed and are negative.       Objective:  BP (!) 138/94 Comment: home  Pulse 88   Temp 98.1 F (36.7 C) (Temporal)   Ht 5\' 7"  (1.702 m)   Wt 203 lb 9.6 oz (92.4 kg)   SpO2 95%   BMI 31.89 kg/m    Wt Readings from Last 3 Encounters:  03/17/23 203 lb 9.6 oz (92.4 kg)  12/15/22 211 lb 6.4 oz (95.9 kg)  09/15/22 216 lb 9.6 oz (98.2 kg)    Physical Exam Vitals and nursing note reviewed.  Constitutional:      General: She is not in acute distress.    Appearance: Normal appearance. She is obese. She is not ill-appearing, toxic-appearing or diaphoretic.  HENT:     Head: Normocephalic and atraumatic.     Nose: Nose normal.     Mouth/Throat:     Mouth: Mucous membranes are moist.  Eyes:     Conjunctiva/sclera: Conjunctivae normal.     Pupils: Pupils are equal, round, and reactive to light.  Cardiovascular:     Rate and Rhythm: Normal rate and regular rhythm.  Pulmonary:     Effort: Pulmonary effort is normal.     Breath sounds: Normal breath sounds.  Musculoskeletal:     Cervical back: Neck supple.     Right lower leg: No edema.     Left lower leg: No edema.  Skin:    General: Skin is warm and dry.     Capillary Refill: Capillary refill takes less than 2 seconds.  Neurological:     General: No focal deficit present.     Mental Status: She is alert and oriented to person, place, and time.  Psychiatric:        Mood and Affect: Mood normal.        Behavior: Behavior normal.        Thought Content: Thought content normal.        Judgment: Judgment normal.     Results for orders placed or performed in visit on 12/15/22  Lipid panel  Result Value Ref Range   Cholesterol, Total 244 (H) 100 - 199 mg/dL   Triglycerides 403 (H) 0 - 149 mg/dL   HDL 37 (L) >47 mg/dL   VLDL Cholesterol Cal 31 5 - 40 mg/dL   LDL Chol Calc (NIH) 425 (H) 0 - 99 mg/dL   Chol/HDL Ratio 6.6 (H) 0.0 -  4.4 ratio  CBC with Differential/Platelet  Result Value Ref Range   WBC 6.9  3.4 - 10.8 x10E3/uL   RBC 5.02 3.77 - 5.28 x10E6/uL   Hemoglobin 14.1 11.1 - 15.9 g/dL   Hematocrit 53.6 64.4 - 46.6 %   MCV 86 79 - 97 fL   MCH 28.1 26.6 - 33.0 pg   MCHC 32.7 31.5 - 35.7 g/dL   RDW 03.4 74.2 - 59.5 %   Platelets 293 150 - 450 x10E3/uL   Neutrophils 55 Not Estab. %   Lymphs 34 Not Estab. %   Monocytes 8 Not Estab. %   Eos 2 Not Estab. %   Basos 1 Not Estab. %   Neutrophils Absolute 3.8 1.4 - 7.0 x10E3/uL   Lymphocytes Absolute 2.4 0.7 - 3.1 x10E3/uL   Monocytes Absolute 0.5 0.1 - 0.9 x10E3/uL   EOS (ABSOLUTE) 0.1 0.0 - 0.4 x10E3/uL   Basophils Absolute 0.1 0.0 - 0.2 x10E3/uL   Immature Granulocytes 0 Not Estab. %   Immature Grans (Abs) 0.0 0.0 - 0.1 x10E3/uL  CMP14+EGFR  Result Value Ref Range   Glucose 154 (H) 70 - 99 mg/dL   BUN 15 6 - 24 mg/dL   Creatinine, Ser 6.38 0.57 - 1.00 mg/dL   eGFR 756 >43 PI/RJJ/8.84   BUN/Creatinine Ratio 23 9 - 23   Sodium 138 134 - 144 mmol/L   Potassium 4.4 3.5 - 5.2 mmol/L   Chloride 102 96 - 106 mmol/L   CO2 19 (L) 20 - 29 mmol/L   Calcium 9.5 8.7 - 10.2 mg/dL   Total Protein 7.0 6.0 - 8.5 g/dL   Albumin 4.2 3.8 - 4.9 g/dL   Globulin, Total 2.8 1.5 - 4.5 g/dL   Albumin/Globulin Ratio 1.5 1.2 - 2.2   Bilirubin Total 0.4 0.0 - 1.2 mg/dL   Alkaline Phosphatase 94 44 - 121 IU/L   AST 13 0 - 40 IU/L   ALT 13 0 - 32 IU/L  Bayer DCA Hb A1c Waived  Result Value Ref Range   HB A1C (BAYER DCA - WAIVED) 7.3 (H) 4.8 - 5.6 %  Thyroid Panel With TSH  Result Value Ref Range   TSH 4.910 (H) 0.450 - 4.500 uIU/mL   T4, Total 9.2 4.5 - 12.0 ug/dL   T3 Uptake Ratio 23 (L) 24 - 39 %   Free Thyroxine Index 2.1 1.2 - 4.9  VITAMIN D 25 Hydroxy (Vit-D Deficiency, Fractures)  Result Value Ref Range   Vit D, 25-Hydroxy 35.3 30.0 - 100.0 ng/mL  Hormone Panel  Result Value Ref Range   TSH 5.1 (H) uU/mL   T4 7.6 ug/dL   Free T-3 3.4 pg/mL   Triiodothyronine (T-3), Serum 143 ng/dL   Testosterone, Serum (Total) 17 ng/dL   Free  Testosterone, Serum 1.0 (L) pg/mL   Testosterone-% Free 0.6 %   Follicle Stimulating Hormone 36 mIU/mL   Progesterone, Serum <10 ng/dL   DHEA-Sulfate, LCMS <16 ug/dL   Sex Hormone Binding Globulin 104.0 nmol/L   Estrone Sulfate 11 ng/dL   Estradiol, Serum, MS 9.1 pg/mL       Pertinent labs & imaging results that were available during my care of the patient were reviewed by me and considered in my medical decision making.  Assessment & Plan:  Navdeep "Okey Regal" was seen today for diabetes.  Diagnoses and all orders for this visit:  Type 2 diabetes mellitus with other specified complication, without long-term current use of insulin (HCC) A1C 6.9, great job! Will  increase Mounjaro to 10 mg weekly for better control and weight reduction. If A1C remains at goal, will consider stopping Januvia. Diet and exercise encouraged. Labs pending.  -     Lipid panel -     Bayer DCA Hb A1c Waived -     Thyroid Panel With TSH -     Microalbumin / creatinine urine ratio -     tirzepatide (MOUNJARO) 10 MG/0.5ML Pen; Inject 10 mg into the skin once a week.  Hypertension associated with diabetes (HCC) Has been well controlled at home. Aware to restart amlodipine. Pt to continue to monitor at home and report persistent elevated readings. DASH diet and exercise encouraged.  -     Lipid panel -     Bayer DCA Hb A1c Waived -     Thyroid Panel With TSH -     Microalbumin / creatinine urine ratio  Hyperlipidemia associated with type 2 diabetes mellitus (HCC) Diet encouraged - increase intake of fresh fruits and vegetables, increase intake of lean proteins. Bake, broil, or grill foods. Avoid fried, greasy, and fatty foods. Avoid fast foods. Increase intake of fiber-rich whole grains. Exercise encouraged - at least 150 minutes per week and advance as tolerated.  Goal BMI < 25. Continue medications as prescribed. Follow up in 3-6 months as discussed.  -     Lipid panel  Acquired hypothyroidism Thyroid disease  has been fairly controlled. Labs are pending. Will add medications if warranted. Repeat labs in 3 months if adjustments are made and in 6 months if stable.   -     Thyroid Panel With TSH  Vitamin d deficiency   Labs pending. Continue repletion therapy. If indicated, will change repletion dosage. Eat foods rich in Vit D including milk, orange juice, yogurt with vitamin D added, salmon or mackerel, canned tuna fish, cereals with vitamin D added, and cod liver oil. Get out in the sun but make sure to wear at least SPF 30 sunscreen.   Continue all other maintenance medications.  Follow up plan: Return in about 3 months (around 06/17/2023) for DM, 6 months CPE with PAP.   Continue healthy lifestyle choices, including diet (rich in fruits, vegetables, and lean proteins, and low in salt and simple carbohydrates) and exercise (at least 30 minutes of moderate physical activity daily).  Educational handout given for DM  The above assessment and management plan was discussed with the patient. The patient verbalized understanding of and has agreed to the management plan. Patient is aware to call the clinic if they develop any new symptoms or if symptoms persist or worsen. Patient is aware when to return to the clinic for a follow-up visit. Patient educated on when it is appropriate to go to the emergency department.   Kari Baars, FNP-C [facility] [phone]

## 2023-04-07 ENCOUNTER — Other Ambulatory Visit: Payer: Self-pay | Admitting: Family

## 2023-04-07 DIAGNOSIS — M7582 Other shoulder lesions, left shoulder: Secondary | ICD-10-CM

## 2023-04-07 DIAGNOSIS — M25532 Pain in left wrist: Secondary | ICD-10-CM

## 2023-04-07 NOTE — Telephone Encounter (Signed)
Last office visit 03/17/23 Last refill 8/.11/23 #90, 1 refill

## 2023-06-11 ENCOUNTER — Other Ambulatory Visit: Payer: Self-pay | Admitting: Family Medicine

## 2023-06-11 DIAGNOSIS — M7582 Other shoulder lesions, left shoulder: Secondary | ICD-10-CM

## 2023-06-11 DIAGNOSIS — M25532 Pain in left wrist: Secondary | ICD-10-CM

## 2023-06-16 ENCOUNTER — Other Ambulatory Visit: Payer: Self-pay | Admitting: Family Medicine

## 2023-06-16 DIAGNOSIS — E1169 Type 2 diabetes mellitus with other specified complication: Secondary | ICD-10-CM

## 2023-06-18 ENCOUNTER — Ambulatory Visit: Payer: No Typology Code available for payment source | Admitting: Family Medicine

## 2023-06-25 ENCOUNTER — Telehealth: Payer: Self-pay | Admitting: Family Medicine

## 2023-07-02 ENCOUNTER — Other Ambulatory Visit: Payer: Self-pay | Admitting: Family Medicine

## 2023-07-07 ENCOUNTER — Other Ambulatory Visit: Payer: Self-pay | Admitting: Cardiology

## 2023-07-07 ENCOUNTER — Other Ambulatory Visit: Payer: Self-pay | Admitting: Family Medicine

## 2023-07-07 DIAGNOSIS — E1159 Type 2 diabetes mellitus with other circulatory complications: Secondary | ICD-10-CM

## 2023-07-08 ENCOUNTER — Encounter: Payer: Self-pay | Admitting: Family Medicine

## 2023-07-08 ENCOUNTER — Telehealth: Payer: No Typology Code available for payment source | Admitting: Family Medicine

## 2023-07-08 DIAGNOSIS — E039 Hypothyroidism, unspecified: Secondary | ICD-10-CM

## 2023-07-08 DIAGNOSIS — E559 Vitamin D deficiency, unspecified: Secondary | ICD-10-CM

## 2023-07-08 DIAGNOSIS — I152 Hypertension secondary to endocrine disorders: Secondary | ICD-10-CM

## 2023-07-08 DIAGNOSIS — E1159 Type 2 diabetes mellitus with other circulatory complications: Secondary | ICD-10-CM | POA: Diagnosis not present

## 2023-07-08 DIAGNOSIS — E785 Hyperlipidemia, unspecified: Secondary | ICD-10-CM

## 2023-07-08 DIAGNOSIS — E1169 Type 2 diabetes mellitus with other specified complication: Secondary | ICD-10-CM

## 2023-07-08 DIAGNOSIS — Z7985 Long-term (current) use of injectable non-insulin antidiabetic drugs: Secondary | ICD-10-CM

## 2023-07-08 MED ORDER — ATENOLOL 25 MG PO TABS
25.0000 mg | ORAL_TABLET | Freq: Every evening | ORAL | 3 refills | Status: DC
Start: 2023-07-08 — End: 2024-01-19

## 2023-07-08 MED ORDER — TIRZEPATIDE 12.5 MG/0.5ML ~~LOC~~ SOAJ
12.5000 mg | SUBCUTANEOUS | 0 refills | Status: AC
Start: 2023-07-08 — End: 2023-10-06

## 2023-07-08 NOTE — Progress Notes (Signed)
Virtual Visit via Video   I connected with patient on 07/08/23 at 1000 by a video enabled telemedicine application and verified that I am speaking with the correct person using two identifiers.  Location patient: Home Location provider: [facility] Persons participating in the virtual visit: Patient and Provider  I discussed the limitations of evaluation and management by telemedicine and the availability of in person appointments. The patient expressed understanding and agreed to proceed.  Subjective:   HPI:  Pt presents today for  Chief Complaint  Patient presents with   Medical Management of Chronic Issues   Discussed the use of AI scribe software for clinical note transcription with the patient, who gave verbal consent to proceed.  History of Present Illness   The patient, with a history of hypertension and diabetes, reports a recent illness that has led to an increase in blood sugar levels, ranging from 140-150, compared to their usual range of 98-122. They attribute this increase to a change in diet due to illness, consuming foods such as spaghetti and meatballs and cheese pizzas, which they acknowledge are not beneficial for their health.  Despite the illness, the patient notes a decrease in weight and an improvement in appetite. They have not taken any blood pressure medication since their last visit in July. Prior to the illness, their blood pressure readings were consistently within the normal range (118-125). However, during the illness, their blood pressure increased to 145 and even reached 190 on one occasion after consuming Nyquil. The patient expresses concern about these elevated readings and requests a change in their blood pressure medication.  The patient is currently on a 10mg  dose of Mounjaro (tirzepatide) for their diabetes and wishes to increase the dose to 15mg . They also mention financial constraints due to their partner's upcoming  surgery and absence from work, which will impact their ability to afford their medications.  The patient also expresses interest in having a DEXA scan for bone density, as they have been advised that it may be necessary for those on Mounjaro. They report feeling stronger and healthier overall, despite the presence of some residual fat. They are eager to continue their weight loss journey and believe that they may not need blood pressure medication once they lose more weight. However, they acknowledge that their blood pressure still tends to rise during migraines or episodes of back pain.       Review of Systems  Constitutional:  Negative for chills, diaphoresis, fever, malaise/fatigue and weight loss.  HENT:  Positive for congestion and ear pain. Negative for ear discharge, hearing loss, nosebleeds, sinus pain, sore throat and tinnitus.   Respiratory:  Positive for cough. Negative for hemoptysis, sputum production, shortness of breath, wheezing and stridor.   Cardiovascular:  Negative for chest pain, palpitations, orthopnea, claudication, leg swelling and PND.  Endo/Heme/Allergies:  Negative for environmental allergies and polydipsia. Does not bruise/bleed easily.  All other systems reviewed and are negative.    Patient Active Problem List   Diagnosis Date Noted   Acquired dilation of ascending aorta and aortic root (HCC) 07/21/2022   Diabetes mellitus (HCC) 01/09/2022   Hypertension associated with diabetes (HCC) 01/09/2022   Hyperlipidemia associated with type 2 diabetes mellitus (HCC) 01/09/2022   Acquired hypothyroidism 01/09/2022   Vitamin D deficiency 01/09/2022   Ascending aortic aneurysm (HCC) 10/11/2020    Social History   Tobacco Use   Smoking status: Former   Smokeless tobacco: Never  Substance Use Topics  Alcohol use: No    Current Outpatient Medications:    atenolol (TENORMIN) 25 MG tablet, Take 1 tablet (25 mg total) by mouth at bedtime., Disp: 90 tablet, Rfl: 3    tirzepatide (MOUNJARO) 12.5 MG/0.5ML Pen, Inject 12.5 mg into the skin once a week., Disp: 6 mL, Rfl: 0   albuterol (VENTOLIN HFA) 108 (90 Base) MCG/ACT inhaler, INHALE 2 PUFFS INTO THE LUNGS EVERY 6 HOURS AS NEEDED FOR WHEEZING OR SHORTNESS OF BREATH, Disp: 6.7 each, Rfl: 1   aspirin 81 MG chewable tablet, Chew by mouth., Disp: , Rfl:    butalbital-acetaminophen-caffeine (FIORICET) 50-325-40 MG tablet, Take 1 tablet by mouth 2 (two) times daily., Disp: , Rfl:    cetirizine (ZYRTEC) 10 MG tablet, Take by mouth., Disp: , Rfl:    cyclobenzaprine (FLEXERIL) 10 MG tablet, TAKE 1 TABLET BY MOUTH THREE TIMES A DAY AS NEEDED FOR MUSCLE SPASM, Disp: 90 tablet, Rfl: 0   fluticasone (FLONASE) 50 MCG/ACT nasal spray, Place two sprays in each nostril once daily, Disp: 16 g, Rfl: 1   JANUVIA 50 MG tablet, TAKE 1 TABLET BY MOUTH EVERY DAY, Disp: 90 tablet, Rfl: 0   MELATONIN PO, Take by mouth., Disp: , Rfl:    metoprolol succinate (TOPROL-XL) 100 MG 24 hr tablet, Take 1 tablet (100 mg total) by mouth daily. Take with or immediately following a meal., Disp: 90 tablet, Rfl: 3   NON FORMULARY, OmegaCo3, Disp: , Rfl:    NON FORMULARY, Liqua-D (K-87), Disp: , Rfl:    OVER THE COUNTER MEDICATION, Thyroid support, Disp: , Rfl:    OVER THE COUNTER MEDICATION, Thyroid support, Disp: , Rfl:    pregabalin (LYRICA) 25 MG capsule, Take 25 mg by mouth 2 (two) times daily., Disp: , Rfl:    rosuvastatin (CRESTOR) 20 MG tablet, Take by mouth., Disp: , Rfl:    SUMAtriptan (IMITREX) 100 MG tablet, TAKE 1 TABLET AT ONSET OF MIGRAINE. REPEAT ONLY IF NEEDED IN 2 HOURS. NO MORE THAN 2 IN 24 HOURS, Disp: 9 tablet, Rfl: 0  Allergies  Allergen Reactions   Gabapentin Shortness Of Breath   Sulfa Antibiotics Hives    REACTION: GI upset   Nitrofurantoin Macrocrystal Other (See Comments)    Muscle spasms    Codeine    Metformin     REACTION: diarrhea   Penicillins     REACTION: childhood reaction wtih Scarlett Fever   Sulfonamide  Derivatives     REACTION: GI upset   Lisinopril Other (See Comments)    Objective:   There were no vitals taken for this visit.  Patient is well-developed, well-nourished in no acute distress.  Resting comfortably at home.  Head is normocephalic, atraumatic.  No labored breathing.  Speech is clear and coherent with logical content.  Patient is alert and oriented at baseline.    Assessment and Plan:   Jenny "Okey Regal" was seen today for medical management of chronic issues.  Diagnoses and all orders for this visit:  Hypertension associated with diabetes (HCC) -     Bayer DCA Hb A1c Waived; Future -     CMP14+EGFR; Future -     CBC with Differential/Platelet; Future -     Lipid panel; Future -     Thyroid Panel With TSH; Future -     atenolol (TENORMIN) 25 MG tablet; Take 1 tablet (25 mg total) by mouth at bedtime.  Hyperlipidemia associated with type 2 diabetes mellitus (HCC) Diet encouraged - increase intake of fresh fruits and  vegetables, increase intake of lean proteins. Bake, broil, or grill foods. Avoid fried, greasy, and fatty foods. Avoid fast foods. Increase intake of fiber-rich whole grains. Exercise encouraged - at least 150 minutes per week and advance as tolerated.  Goal BMI < 25. Continue medications as prescribed. Follow up in 3-6 months as discussed.  -     CMP14+EGFR; Future -     Lipid panel; Future  Type 2 diabetes mellitus with other specified complication, without long-term current use of insulin (HCC) -     Bayer DCA Hb A1c Waived; Future -     CMP14+EGFR; Future -     CBC with Differential/Platelet; Future -     Lipid panel; Future -     Thyroid Panel With TSH; Future -     VITAMIN D 25 Hydroxy (Vit-D Deficiency, Fractures); Future -     tirzepatide (MOUNJARO) 12.5 MG/0.5ML Pen; Inject 12.5 mg into the skin once a week.  Acquired hypothyroidism Thyroid disease has been well controlled. Labs are pending. Adjustments to regimen will be made if  warranted. Make sure to take medications on an empty stomach with a full glass of water. Make sure to avoid vitamins or supplements for at least 4 hours before and 4 hours after taking medications. Repeat labs in 3 months if adjustments are made and in 6 months if stable.   -     Thyroid Panel With TSH; Future  Vitamin D deficiency Labs pending. Continue repletion therapy. If indicated, will change repletion dosage. Eat foods rich in Vit D including milk, orange juice, yogurt with vitamin D added, salmon or mackerel, canned tuna fish, cereals with vitamin D added, and cod liver oil. Get out in the sun but make sure to wear at least SPF 30 sunscreen.  -     CMP14+EGFR; Future -     VITAMIN D 25 Hydroxy (Vit-D Deficiency, Fractures); Future   Assessment and Plan    Hypertension Reports fluctuating blood pressure readings, with recent spikes up to 190 mmHg, likely exacerbated by illness and use of over-the-counter medications like Nyquil and Theraflu. Generally well-controlled without medication, but recent illness has caused elevations. Discussed risks of uncontrolled hypertension, including cardiovascular events, and benefits of medication. Prefers to avoid amlodipine due to side effects and has not taken losartan. Agreed to try atenolol, low dose, nightly. - Prescribe low-dose atenolol, nightly - Send prescription to [facility] - Monitor blood pressure regularly  Type 2 Diabetes Mellitus Blood sugars well-controlled with current medication regimen, but recent illness and dietary changes have caused temporary elevations. Currently on tirzepatide [facility]) 10 mg and expresses desire to increase dose to 12.5 mg. Discussed financial concerns and need to manage medication costs due to upcoming out-of-pocket expenses. - Increase tirzepatide (Mounjaro) dose to 12.5 mg - Send prescription for 90-day supply to [facility] - Order HbA1c and other relevant labs  General Health Maintenance Inquires  about bone density testing due to concerns related to medication use. Discussed importance of regular screenings. Has lost significant weight and feels stronger but wants to ensure bone health. - Order DEXA scan for bone density screening - Order comprehensive lab work including cholesterol, thyroid, vitamin D, and HbA1c - Schedule lab draw appointment and follow-up visit in three months.        Return in about 3 months (around 10/08/2023) for DM.  Kari Baars, FNP-C [facility] [address] [place], [place] [address] [phone]  07/08/2023  Time spent with the  patient: 15 minutes, of which >50% was spent in obtaining information about symptoms, reviewing previous labs, evaluations, and treatments, counseling about condition (please see the discussed topics above), and developing a plan to further investigate it; had a number of questions which I addressed.

## 2023-07-08 NOTE — Patient Instructions (Signed)

## 2023-07-09 ENCOUNTER — Encounter: Payer: Self-pay | Admitting: *Deleted

## 2023-08-02 ENCOUNTER — Other Ambulatory Visit: Payer: Self-pay | Admitting: Cardiology

## 2023-08-13 ENCOUNTER — Other Ambulatory Visit: Payer: Self-pay | Admitting: Cardiology

## 2023-09-17 ENCOUNTER — Encounter: Payer: No Typology Code available for payment source | Admitting: Family Medicine

## 2023-09-23 ENCOUNTER — Ambulatory Visit: Payer: No Typology Code available for payment source | Admitting: Family Medicine

## 2023-09-28 ENCOUNTER — Other Ambulatory Visit: Payer: No Typology Code available for payment source

## 2023-09-28 ENCOUNTER — Ambulatory Visit: Payer: No Typology Code available for payment source | Admitting: Family Medicine

## 2023-10-01 ENCOUNTER — Other Ambulatory Visit: Payer: Self-pay | Admitting: Family Medicine

## 2023-10-01 DIAGNOSIS — I152 Hypertension secondary to endocrine disorders: Secondary | ICD-10-CM

## 2023-10-06 ENCOUNTER — Telehealth (INDEPENDENT_AMBULATORY_CARE_PROVIDER_SITE_OTHER): Payer: No Typology Code available for payment source | Admitting: Family Medicine

## 2023-10-06 ENCOUNTER — Encounter: Payer: Self-pay | Admitting: Family Medicine

## 2023-10-06 DIAGNOSIS — E1159 Type 2 diabetes mellitus with other circulatory complications: Secondary | ICD-10-CM

## 2023-10-06 DIAGNOSIS — E559 Vitamin D deficiency, unspecified: Secondary | ICD-10-CM

## 2023-10-06 DIAGNOSIS — I152 Hypertension secondary to endocrine disorders: Secondary | ICD-10-CM

## 2023-10-06 DIAGNOSIS — E785 Hyperlipidemia, unspecified: Secondary | ICD-10-CM | POA: Diagnosis not present

## 2023-10-06 DIAGNOSIS — E1169 Type 2 diabetes mellitus with other specified complication: Secondary | ICD-10-CM | POA: Diagnosis not present

## 2023-10-06 DIAGNOSIS — E039 Hypothyroidism, unspecified: Secondary | ICD-10-CM

## 2023-10-06 DIAGNOSIS — Z7985 Long-term (current) use of injectable non-insulin antidiabetic drugs: Secondary | ICD-10-CM

## 2023-10-06 DIAGNOSIS — E6609 Other obesity due to excess calories: Secondary | ICD-10-CM | POA: Insufficient documentation

## 2023-10-06 DIAGNOSIS — Z6833 Body mass index (BMI) 33.0-33.9, adult: Secondary | ICD-10-CM

## 2023-10-06 DIAGNOSIS — I77810 Thoracic aortic ectasia: Secondary | ICD-10-CM

## 2023-10-06 DIAGNOSIS — E66811 Obesity, class 1: Secondary | ICD-10-CM

## 2023-10-06 DIAGNOSIS — M7918 Myalgia, other site: Secondary | ICD-10-CM

## 2023-10-06 NOTE — Progress Notes (Signed)
Virtual Visit via Video   I connected with patient on 10/06/23 at 1105 by a video enabled telemedicine application and verified that I am speaking with the correct person using two identifiers.  Location patient: Home Location provider: [facility] Persons participating in the virtual visit: Patient and Provider  I discussed the limitations of evaluation and management by telemedicine and the availability of in person appointments. The patient expressed understanding and agreed to proceed.  Subjective:   HPI:  Pt presents today for  Chief Complaint  Patient presents with   Medical Management of Chronic Issues    History of Present Illness   Jenny Willis "Okey Regal" is a 58 year old female with hypertension and diabetes who presents for a follow-up visit.  She has been experiencing elevated blood pressure, with a recent reading of 152/98 mmHg. She is currently taking atenolol for management. She believes her blood pressure may be influenced by recent activity and dietary intake, including a breakfast of one egg with cheese and a piece of bacon.  She has a history of diabetes, with a morning blood sugar level of 135 mg/dL. She is on Mounjaro 10 mg, with plans to increase to 12.5 mg in two weeks, and also takes Januvia, which she finds effective. Two weeks ago, her blood sugar spiked to 288 mg/dL after consuming two [employer] donuts, but decreased to 202 mg/dL after taking apple cider vinegar. Her morning blood sugar levels typically range from 98 to 102 mg/dL.  She has difficulty with weight loss, fluctuating between 178 to 182 pounds since November, attributing this to a lack of physical activity and being at a 'plateau'.  She is no longer on pravastatin or rosuvastatin for cholesterol management and is currently taking red yeast rice. She has not had recent lab work due to transportation issues and inclement weather.  She experiences whole body  pain, described as a dull ache that sometimes intensifies, affecting her from head to toe, including her fingertips. She manages the pain with ibuprofen and Tylenol alternately.  Her right eye has become blurry again over the past two weeks, and she plans to see an eye doctor once her blood pressure stabilizes.  She mentions a recent change in her insurance plan, affecting her medication coverage, leading to out-of-pocket expenses. She is awaiting Medicare coverage starting June 1st.       ROS per HPI   Patient Active Problem List   Diagnosis Date Noted   Class 1 obesity due to excess calories with serious comorbidity and body mass index (BMI) of 33.0 to 33.9 in adult 10/06/2023   Acquired dilation of ascending aorta and aortic root (HCC) 07/21/2022   Diabetes mellitus (HCC) 01/09/2022   Hypertension associated with diabetes (HCC) 01/09/2022   Hyperlipidemia associated with type 2 diabetes mellitus (HCC) 01/09/2022   Acquired hypothyroidism 01/09/2022   Vitamin D deficiency 01/09/2022   Ascending aortic aneurysm (HCC) 10/11/2020    Social History   Tobacco Use   Smoking status: Former   Smokeless tobacco: Never  Substance Use Topics   Alcohol use: No    Current Outpatient Medications:    Red Yeast Rice 600 MG CAPS, Take 2,400 mg by mouth daily., Disp: , Rfl:    albuterol (VENTOLIN HFA) 108 (90 Base) MCG/ACT inhaler, INHALE 2 PUFFS INTO THE LUNGS EVERY 6 HOURS AS NEEDED FOR WHEEZING OR SHORTNESS OF BREATH, Disp: 6.7 each, Rfl: 1   aspirin 81 MG chewable tablet, Chew by mouth.,  Disp: , Rfl:    atenolol (TENORMIN) 25 MG tablet, Take 1 tablet (25 mg total) by mouth at bedtime., Disp: 90 tablet, Rfl: 3   butalbital-acetaminophen-caffeine (FIORICET) 50-325-40 MG tablet, Take 1 tablet by mouth 2 (two) times daily., Disp: , Rfl:    cetirizine (ZYRTEC) 10 MG tablet, Take by mouth., Disp: , Rfl:    cyclobenzaprine (FLEXERIL) 10 MG tablet, TAKE 1 TABLET BY MOUTH THREE TIMES A DAY AS NEEDED  FOR MUSCLE SPASM, Disp: 90 tablet, Rfl: 0   fluticasone (FLONASE) 50 MCG/ACT nasal spray, Place two sprays in each nostril once daily, Disp: 16 g, Rfl: 1   JANUVIA 50 MG tablet, TAKE 1 TABLET BY MOUTH EVERY DAY, Disp: 90 tablet, Rfl: 0   MELATONIN PO, Take by mouth., Disp: , Rfl:    NON FORMULARY, OmegaCo3, Disp: , Rfl:    NON FORMULARY, Liqua-D (K-87), Disp: , Rfl:    OVER THE COUNTER MEDICATION, Thyroid support, Disp: , Rfl:    OVER THE COUNTER MEDICATION, Thyroid support, Disp: , Rfl:    pregabalin (LYRICA) 25 MG capsule, Take 25 mg by mouth 2 (two) times daily., Disp: , Rfl:    SUMAtriptan (IMITREX) 100 MG tablet, TAKE 1 TABLET AT ONSET OF MIGRAINE. REPEAT ONLY IF NEEDED IN 2 HOURS. NO MORE THAN 2 IN 24 HOURS, Disp: 9 tablet, Rfl: 0   tirzepatide (MOUNJARO) 12.5 MG/0.5ML Pen, Inject 12.5 mg into the skin once a week., Disp: 6 mL, Rfl: 0  Allergies  Allergen Reactions   Gabapentin Shortness Of Breath   Sulfa Antibiotics Hives    REACTION: GI upset   Nitrofurantoin Macrocrystal Other (See Comments)    Muscle spasms    Codeine    Metformin     REACTION: diarrhea   Penicillins     REACTION: childhood reaction wtih Scarlett Fever   Sulfonamide Derivatives     REACTION: GI upset   Lisinopril Other (See Comments)    Objective:   There were no vitals taken for this visit.  Patient is well-developed, well-nourished in no acute distress.  Resting comfortably at home.  Head is normocephalic, atraumatic.  No labored breathing.  Speech is clear and coherent with logical content.  Patient is alert and oriented at baseline.    Assessment and Plan:   Chanae "Okey Regal" was seen today for medical management of chronic issues.  Diagnoses and all orders for this visit:  Type 2 diabetes mellitus with other specified complication, without long-term current use of insulin (HCC) -     Bayer DCA Hb A1c Waived -     CMP14+EGFR -     CBC with Differential/Platelet -     Lipid  panel  Hypertension associated with diabetes (HCC) -     CMP14+EGFR -     CBC with Differential/Platelet -     Lipid panel -     Thyroid Panel With TSH  Hyperlipidemia associated with type 2 diabetes mellitus (HCC) -     CMP14+EGFR -     Lipid panel  Vitamin D deficiency -     CMP14+EGFR -     VITAMIN D 25 Hydroxy (Vit-D Deficiency, Fractures)  Acquired hypothyroidism -     Thyroid Panel With TSH  Class 1 obesity due to excess calories with serious comorbidity and body mass index (BMI) of 33.0 to 33.9 in adult -     Bayer DCA Hb A1c Waived -     CMP14+EGFR -     CBC with Differential/Platelet -  Lipid panel -     Thyroid Panel With TSH -     VITAMIN D 25 Hydroxy (Vit-D Deficiency, Fractures) -     Vitamin B12  Acquired dilation of ascending aorta and aortic root (HCC) -     Lipid panel  Myalgia, multiple sites -     CK isoenzymes (brain, muscle injury) -     Vitamin B12    Assessment and Plan    Hypertension Elevated blood pressure reading of 152/98 mmHg. Currently on atenolol. Recent dietary intake, including bacon, may have contributed to the elevated reading. - Continue atenolol - Monitor blood pressure at home  Type 2 Diabetes Mellitus Blood glucose levels are generally well-controlled with Mounjaro and Januvia. Recent dietary indiscretions led to a temporary spike in blood glucose to 288 mg/dL. Awaiting Medicare coverage for continued Januvia prescription. Discussed high medication costs and potential need for a doctor's note for coverage exception. - Continue Mounjaro 10 mg, increase to 12.5 mg in two weeks - Continue Januvia - Monitor blood glucose levels regularly - Consider apple cider vinegar for acute hyperglycemia management - Schedule follow-up to discuss Medicare coverage for Januvia  Hyperlipidemia Managed with red yeast rice. Reports generalized body pain, possibly related to red yeast rice. Discussed potential muscle damage and need to monitor  muscle enzymes. - Reduce red yeast rice to three times a week - Order blood work to check muscle enzymes  Generalized Body Pain Intermittent, diffuse body pain, possibly related to red yeast rice. Pain severe enough to disrupt sleep on two occasions. Discussed potential muscle damage and need to monitor muscle enzymes. - Reduce red yeast rice to three times a week - Order blood work to check muscle enzymes  General Health Maintenance Stable weight between 178-182 lbs since November. Needs more physical activity. Discussed importance of regular physical activity for weight management and overall health. - Encourage increased physical activity - Monitor weight regularly  Follow-up - Schedule follow-up appointment in June - Ensure all medications are refilled as needed - Call office to schedule blood work and follow-up visit.        Return in 3 months (on 01/03/2024) for DM, all labs .  Kari Baars, FNP-C [facility] [address] [place], [place] [address] [phone]  10/06/2023  Time spent with the patient: 20 minutes, of which >50% was spent in obtaining information about symptoms, reviewing previous labs, evaluations, and treatments, counseling about condition (please see the discussed topics above), and developing a plan to further investigate it; had a number of questions which I addressed.

## 2023-10-06 NOTE — Patient Instructions (Signed)

## 2024-01-17 ENCOUNTER — Other Ambulatory Visit: Payer: Self-pay

## 2024-01-17 ENCOUNTER — Other Ambulatory Visit: Payer: Medicare (Managed Care)

## 2024-01-17 DIAGNOSIS — E559 Vitamin D deficiency, unspecified: Secondary | ICD-10-CM

## 2024-01-17 DIAGNOSIS — E1169 Type 2 diabetes mellitus with other specified complication: Secondary | ICD-10-CM | POA: Diagnosis not present

## 2024-01-17 DIAGNOSIS — E039 Hypothyroidism, unspecified: Secondary | ICD-10-CM

## 2024-01-17 DIAGNOSIS — I152 Hypertension secondary to endocrine disorders: Secondary | ICD-10-CM | POA: Diagnosis not present

## 2024-01-17 DIAGNOSIS — E1159 Type 2 diabetes mellitus with other circulatory complications: Secondary | ICD-10-CM | POA: Diagnosis not present

## 2024-01-17 LAB — BAYER DCA HB A1C WAIVED: HB A1C (BAYER DCA - WAIVED): 6.2 % — ABNORMAL HIGH (ref 4.8–5.6)

## 2024-01-19 DIAGNOSIS — M461 Sacroiliitis, not elsewhere classified: Secondary | ICD-10-CM | POA: Diagnosis not present

## 2024-01-19 DIAGNOSIS — M25512 Pain in left shoulder: Secondary | ICD-10-CM | POA: Diagnosis not present

## 2024-01-20 ENCOUNTER — Ambulatory Visit (INDEPENDENT_AMBULATORY_CARE_PROVIDER_SITE_OTHER): Payer: Medicare (Managed Care) | Admitting: Family Medicine

## 2024-01-20 ENCOUNTER — Encounter: Payer: Self-pay | Admitting: Family Medicine

## 2024-01-20 ENCOUNTER — Ambulatory Visit: Payer: Self-pay | Admitting: Family Medicine

## 2024-01-20 VITALS — BP 120/70 | HR 78 | Temp 97.1°F | Ht 67.0 in | Wt 190.8 lb

## 2024-01-20 DIAGNOSIS — E1169 Type 2 diabetes mellitus with other specified complication: Secondary | ICD-10-CM | POA: Diagnosis not present

## 2024-01-20 DIAGNOSIS — E039 Hypothyroidism, unspecified: Secondary | ICD-10-CM

## 2024-01-20 DIAGNOSIS — Z90710 Acquired absence of both cervix and uterus: Secondary | ICD-10-CM | POA: Diagnosis not present

## 2024-01-20 DIAGNOSIS — E1141 Type 2 diabetes mellitus with diabetic mononeuropathy: Secondary | ICD-10-CM

## 2024-01-20 DIAGNOSIS — Z6829 Body mass index (BMI) 29.0-29.9, adult: Secondary | ICD-10-CM | POA: Diagnosis not present

## 2024-01-20 DIAGNOSIS — I77810 Thoracic aortic ectasia: Secondary | ICD-10-CM

## 2024-01-20 DIAGNOSIS — E1159 Type 2 diabetes mellitus with other circulatory complications: Secondary | ICD-10-CM

## 2024-01-20 DIAGNOSIS — E1140 Type 2 diabetes mellitus with diabetic neuropathy, unspecified: Secondary | ICD-10-CM | POA: Insufficient documentation

## 2024-01-20 DIAGNOSIS — E559 Vitamin D deficiency, unspecified: Secondary | ICD-10-CM | POA: Diagnosis not present

## 2024-01-20 DIAGNOSIS — I152 Hypertension secondary to endocrine disorders: Secondary | ICD-10-CM

## 2024-01-20 DIAGNOSIS — H5203 Hypermetropia, bilateral: Secondary | ICD-10-CM | POA: Diagnosis not present

## 2024-01-20 DIAGNOSIS — E785 Hyperlipidemia, unspecified: Secondary | ICD-10-CM

## 2024-01-20 DIAGNOSIS — Z23 Encounter for immunization: Secondary | ICD-10-CM

## 2024-01-20 DIAGNOSIS — Z1231 Encounter for screening mammogram for malignant neoplasm of breast: Secondary | ICD-10-CM

## 2024-01-20 LAB — CBC WITH DIFFERENTIAL/PLATELET
Basophils Absolute: 0.1 10*3/uL (ref 0.0–0.2)
Basos: 1 %
EOS (ABSOLUTE): 0.1 10*3/uL (ref 0.0–0.4)
Eos: 2 %
Hematocrit: 41.4 % (ref 34.0–46.6)
Hemoglobin: 13.2 g/dL (ref 11.1–15.9)
Immature Grans (Abs): 0 10*3/uL (ref 0.0–0.1)
Immature Granulocytes: 0 %
Lymphocytes Absolute: 2.8 10*3/uL (ref 0.7–3.1)
Lymphs: 42 %
MCH: 27.6 pg (ref 26.6–33.0)
MCHC: 31.9 g/dL (ref 31.5–35.7)
MCV: 87 fL (ref 79–97)
Monocytes Absolute: 0.5 10*3/uL (ref 0.1–0.9)
Monocytes: 7 %
Neutrophils Absolute: 3.2 10*3/uL (ref 1.4–7.0)
Neutrophils: 48 %
Platelets: 279 10*3/uL (ref 150–450)
RBC: 4.78 x10E6/uL (ref 3.77–5.28)
RDW: 12.8 % (ref 11.7–15.4)
WBC: 6.6 10*3/uL (ref 3.4–10.8)

## 2024-01-20 LAB — CMP14+EGFR
ALT: 9 IU/L (ref 0–32)
AST: 14 IU/L (ref 0–40)
Albumin: 4.3 g/dL (ref 3.8–4.9)
Alkaline Phosphatase: 104 IU/L (ref 44–121)
BUN/Creatinine Ratio: 17 (ref 12–28)
BUN: 14 mg/dL (ref 8–27)
Bilirubin Total: 0.3 mg/dL (ref 0.0–1.2)
CO2: 20 mmol/L (ref 20–29)
Calcium: 9.6 mg/dL (ref 8.7–10.3)
Chloride: 99 mmol/L (ref 96–106)
Creatinine, Ser: 0.82 mg/dL (ref 0.57–1.00)
Globulin, Total: 2.8 g/dL (ref 1.5–4.5)
Glucose: 155 mg/dL — ABNORMAL HIGH (ref 70–99)
Potassium: 4.4 mmol/L (ref 3.5–5.2)
Sodium: 135 mmol/L (ref 134–144)
Total Protein: 7.1 g/dL (ref 6.0–8.5)
eGFR: 82 mL/min/{1.73_m2} (ref >59)

## 2024-01-20 LAB — LIPID PANEL
Chol/HDL Ratio: 4.9 ratio — ABNORMAL HIGH (ref 0.0–4.4)
Cholesterol, Total: 220 mg/dL — ABNORMAL HIGH (ref 100–199)
HDL: 45 mg/dL (ref >39)
LDL Chol Calc (NIH): 140 mg/dL — ABNORMAL HIGH (ref 0–99)
Triglycerides: 193 mg/dL — ABNORMAL HIGH (ref 0–149)
VLDL Cholesterol Cal: 35 mg/dL (ref 5–40)

## 2024-01-20 LAB — THYROID PANEL WITH TSH
Free Thyroxine Index: 1.6 (ref 1.2–4.9)
T3 Uptake Ratio: 22 % — ABNORMAL LOW (ref 24–39)
T4, Total: 7.1 ug/dL (ref 4.5–12.0)
TSH: 3.84 u[IU]/mL (ref 0.450–4.500)

## 2024-01-20 LAB — VITAMIN D 25 HYDROXY (VIT D DEFICIENCY, FRACTURES): Vit D, 25-Hydroxy: 32.3 ng/mL (ref 30.0–100.0)

## 2024-01-20 MED ORDER — TIRZEPATIDE 5 MG/0.5ML ~~LOC~~ SOAJ: 5.0000 mg | SUBCUTANEOUS | 6 refills | Status: DC

## 2024-01-20 MED ORDER — ATENOLOL 25 MG PO TABS: 25.0000 mg | ORAL_TABLET | Freq: Every evening | ORAL | 1 refills | Status: DC

## 2024-01-20 NOTE — Progress Notes (Signed)
 Subjective:  Patient ID: Jenny Willis , female    DOB: [birth year], 58 y.o.   MRN: [id number]  Patient Care Team: Galvin Jules, FNP as PCP - General (Family Medicine)   Chief Complaint:  Medical Management of Chronic Issues   HPI: Jenny Willis is a 58 y.o. female presenting on 01/20/2024 for Medical Management of Chronic Issues   Jenny Willis "Jenny Willis" is a 58 year old female with diabetes who presents for a follow-up visit.  She has experienced a weight gain from 172 pounds to 189 pounds over the past few months, which she attributes to poor dietary choices while caring for her great nieces and nephews. Despite this, her blood sugar levels have remained stable, with a recent reading of 118 mg/dL.  She has a history of diabetes and has been managing her condition with medications including Januvia  and Mounjaro . She stopped taking Mounjaro  two months ago due to nausea and vomiting and has not taken Januvia  consistently. Her A1c is currently 6.2% without regular medication use. She experiences 'legs jumping' and neuropathy symptoms when taking Januvia , which she associates with low blood sugar levels. She is managing her diabetes with lifestyle changes, including exercise four days a week and dietary adjustments.  She has a history of hypertension, with home blood pressure readings typically around 112-125 mmHg. She attributes occasional elevated readings to pain from a back issue, for which she is awaiting an MRI and possible injection treatment.  She has a history of migraines but reports significant improvement, not needing Imitrex  in the past year. She uses Flexeril  for muscle relaxation, noting it causes drowsiness if she is not active after taking it.  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 She follows a regimen of natural supplements and dietary choices, such as eating asparagus, to help prevent cancer  recurrence.        Relevant past medical, surgical, family, and social history reviewed and updated as indicated.  Allergies and medications reviewed and updated. Data reviewed: Chart in Epic.   Past Medical History:  Diagnosis Date   Diabetes mellitus without complication (HCC)    Hyperlipidemia    Hypertension    Thyroid  disease     Past Surgical History:  Procedure Laterality Date   ABDOMINAL HYSTERECTOMY     BREAST BIOPSY Left    CYSTECTOMY Left     Social History   Socioeconomic History   Marital status: Married    Spouse name: Not on file   Number of children: Not on file   Years of education: Not on file   Highest education level: Not on file  Occupational History   Not on file  Tobacco Use   Smoking status: Former   Smokeless tobacco: Never  Substance and Sexual Activity   Alcohol use: No   Drug use: No   Sexual activity: Yes  Other Topics Concern   Not on file  Social History Narrative   Not on file   Social Drivers of Health   Financial Resource Strain: Low Risk  (10/01/2021)   Received from [facility], [facility]   Overall Financial Resource Strain (CARDIA)    Difficulty of Paying Living Expenses: Not hard at all  Food Insecurity: No Food Insecurity (10/01/2021)   Received from [facility], [facility]   Hunger Vital Sign    Worried About Running Out of Food in the Last Year: Never  true    Ran Out of Food in the Last Year: Never true  Transportation Needs: No Transportation Needs (10/01/2021)   Received from [facility], [facility]   [facility] - Transportation    Lack of Transportation (Medical): No    Lack of Transportation (Non-Medical): No  Physical Activity: Insufficiently Active (10/01/2021)   Received from [facility], [facility]   Exercise Vital Sign    Days of Exercise per Week: 3 days    Minutes of Exercise per Session: 10 min  Stress: No Stress Concern Present (10/01/2021)   Received from [facility], [facility] - Occupational Stress Questionnaire    Feeling of Stress : Not at all  Social Connections: Unknown (12/15/2021)   Received from [facility], [facility]   Social Network    Social Network: Not on file  Intimate Partner Violence: Unknown (11/19/2021)   Received from [facility], [facility]   HITS    Physically Hurt: Not on file    Insult or Talk Down To: Not on file    Threaten Physical Harm: Not on file    Scream or Curse: Not on file    Outpatient Encounter Medications as of 01/20/2024  Medication Sig   albuterol  (VENTOLIN  HFA) 108 (90 Base) MCG/ACT inhaler INHALE 2 PUFFS INTO THE LUNGS EVERY 6 HOURS AS NEEDED FOR WHEEZING OR SHORTNESS OF BREATH   cetirizine (ZYRTEC) 10 MG tablet Take by mouth.   cyclobenzaprine  (FLEXERIL ) 10 MG tablet TAKE 1 TABLET BY MOUTH THREE TIMES A DAY AS NEEDED FOR MUSCLE SPASM   fluticasone  (FLONASE ) 50 MCG/ACT nasal spray Place two sprays in each nostril once daily   MELATONIN PO Take by mouth.   NON FORMULARY OmegaCo3   NON FORMULARY Liqua-D (K-87)   OVER THE COUNTER MEDICATION Thyroid  support   OVER THE COUNTER MEDICATION Thyroid  support   pregabalin (LYRICA) 25 MG capsule Take 25 mg by mouth 2 (two) times daily.   Red Yeast Rice 600 MG CAPS Take 2,400 mg by mouth daily.   SUMAtriptan  (IMITREX ) 100 MG tablet TAKE 1 TABLET AT ONSET OF MIGRAINE. REPEAT ONLY IF NEEDED IN 2 HOURS. NO MORE THAN 2 IN 24 HOURS   tirzepatide  (MOUNJARO ) 5 MG/0.5ML Pen Inject 5 mg into the skin once a week.   [DISCONTINUED] JANUVIA  50 MG tablet TAKE 1 TABLET BY MOUTH EVERY DAY   aspirin 81 MG chewable tablet Chew by mouth. (Patient not taking: Reported on 01/20/2024)   atenolol  (TENORMIN ) 25 MG tablet Take 1 tablet (25 mg total) by mouth at bedtime.   butalbital-acetaminophen-caffeine (FIORICET) 50-325-40 MG tablet Take 1 tablet by mouth 2 (two) times daily. (Patient not taking: Reported on 01/20/2024)   [DISCONTINUED] atenolol   (TENORMIN ) 25 MG tablet Take 1 tablet (25 mg total) by mouth at bedtime.   No facility-administered encounter medications on file as of 01/20/2024.    Allergies  Allergen Reactions   Gabapentin Shortness Of Breath   Sulfa Antibiotics Hives    REACTION: GI upset   Nitrofurantoin Macrocrystal Other (See Comments)    Muscle spasms    Codeine    Metformin     REACTION: diarrhea   Penicillins     REACTION: childhood reaction wtih Scarlett Fever   Sulfonamide Derivatives     REACTION: GI upset   Lisinopril Other (See Comments)    Pertinent ROS per HPI, otherwise unremarkable      Objective:  BP 120/70 Comment: home  Pulse  78   Temp (!) 97.1 F (36.2 C)   Ht 5\' 7"  (1.702 m)   Wt 190 lb 12.8 oz (86.5 kg)   SpO2 98%   BMI 29.88 kg/m    Wt Readings from Last 3 Encounters:  01/20/24 190 lb 12.8 oz (86.5 kg)  03/17/23 203 lb 9.6 oz (92.4 kg)  12/15/22 211 lb 6.4 oz (95.9 kg)    Physical Exam Vitals and nursing note reviewed.  Constitutional:      General: She is not in acute distress.    Appearance: Normal appearance. She is well-developed, well-groomed and overweight. She is not ill-appearing, toxic-appearing or diaphoretic.  HENT:     Head: Normocephalic and atraumatic.     Jaw: There is normal jaw occlusion.     Right Ear: Hearing normal.     Left Ear: Hearing normal.     Nose: Nose normal.     Mouth/Throat:     Lips: Pink.     Mouth: Mucous membranes are moist.     Pharynx: Oropharynx is clear. Uvula midline.  Eyes:     General: Lids are normal.     Extraocular Movements: Extraocular movements intact.     Conjunctiva/sclera: Conjunctivae normal.     Pupils: Pupils are equal, round, and reactive to light.  Neck:     Thyroid : No thyroid  mass, thyromegaly or thyroid  tenderness.     Vascular: No carotid bruit or JVD.     Trachea: Trachea and phonation normal.  Cardiovascular:     Rate and Rhythm: Normal rate and regular rhythm.     Chest Wall: PMI is not  displaced.     Pulses: Normal pulses.     Heart sounds: Normal heart sounds. No murmur heard.    No friction rub. No gallop.  Pulmonary:     Effort: Pulmonary effort is normal. No respiratory distress.     Breath sounds: Normal breath sounds. No wheezing.  Abdominal:     General: Bowel sounds are normal. There is no distension or abdominal bruit.     Palpations: Abdomen is soft. There is no hepatomegaly or splenomegaly.     Tenderness: There is no abdominal tenderness. There is no right CVA tenderness or left CVA tenderness.     Hernia: No hernia is present.  Musculoskeletal:        General: Normal range of motion.     Cervical back: Normal range of motion and neck supple.     Right lower leg: No edema.     Left lower leg: No edema.  Lymphadenopathy:     Cervical: No cervical adenopathy.  Skin:    General: Skin is warm and dry.     Capillary Refill: Capillary refill takes less than 2 seconds.     Coloration: Skin is not cyanotic, jaundiced or pale.     Findings: No rash.  Neurological:     General: No focal deficit present.     Mental Status: She is alert and oriented to person, place, and time.     Sensory: Sensation is intact.     Motor: Motor function is intact.     Coordination: Coordination is intact.     Gait: Gait is intact.     Deep Tendon Reflexes: Reflexes are normal and symmetric.  Psychiatric:        Attention and Perception: Attention and perception normal.        Mood and Affect: Mood and affect normal.  Speech: Speech normal.        Behavior: Behavior normal. Behavior is cooperative.        Thought Content: Thought content normal.        Cognition and Memory: Cognition and memory normal.        Judgment: Judgment normal.      Results for orders placed or performed in visit on 01/17/24  Bayer DCA Hb A1c Waived   Collection Time: 01/17/24 11:51 AM  Result Value Ref Range   HB A1C (BAYER DCA - WAIVED) 6.2 (H) 4.8 - 5.6 %  VITAMIN D  25 Hydroxy (Vit-D  Deficiency, Fractures)   Collection Time: 01/17/24 11:53 AM  Result Value Ref Range   Vit D, 25-Hydroxy 32.3 30.0 - 100.0 ng/mL  Thyroid  Panel With TSH   Collection Time: 01/17/24 11:53 AM  Result Value Ref Range   TSH 3.840 0.450 - 4.500 uIU/mL   T4, Total 7.1 4.5 - 12.0 ug/dL   T3 Uptake Ratio 22 (L) 24 - 39 %   Free Thyroxine Index 1.6 1.2 - 4.9  Lipid panel   Collection Time: 01/17/24 11:53 AM  Result Value Ref Range   Cholesterol, Total 220 (H) 100 - 199 mg/dL   Triglycerides 960 (H) 0 - 149 mg/dL   HDL 45 >45 mg/dL   VLDL Cholesterol Cal 35 5 - 40 mg/dL   LDL Chol Calc (NIH) 409 (H) 0 - 99 mg/dL   Chol/HDL Ratio 4.9 (H) 0.0 - 4.4 ratio  CBC with Differential/Platelet   Collection Time: 01/17/24 11:53 AM  Result Value Ref Range   WBC 6.6 3.4 - 10.8 x10E3/uL   RBC 4.78 3.77 - 5.28 x10E6/uL   Hemoglobin 13.2 11.1 - 15.9 g/dL   Hematocrit 81.1 91.4 - 46.6 %   MCV 87 79 - 97 fL   MCH 27.6 26.6 - 33.0 pg   MCHC 31.9 31.5 - 35.7 g/dL   RDW 78.2 95.6 - 21.3 %   Platelets 279 150 - 450 x10E3/uL   Neutrophils 48 Not Estab. %   Lymphs 42 Not Estab. %   Monocytes 7 Not Estab. %   Eos 2 Not Estab. %   Basos 1 Not Estab. %   Neutrophils Absolute 3.2 1.4 - 7.0 x10E3/uL   Lymphocytes Absolute 2.8 0.7 - 3.1 x10E3/uL   Monocytes Absolute 0.5 0.1 - 0.9 x10E3/uL   EOS (ABSOLUTE) 0.1 0.0 - 0.4 x10E3/uL   Basophils Absolute 0.1 0.0 - 0.2 x10E3/uL   Immature Granulocytes 0 Not Estab. %   Immature Grans (Abs) 0.0 0.0 - 0.1 x10E3/uL  CMP14+EGFR   Collection Time: 01/17/24 11:53 AM  Result Value Ref Range   Glucose 155 (H) 70 - 99 mg/dL   BUN 14 8 - 27 mg/dL   Creatinine, Ser 0.86 0.57 - 1.00 mg/dL   eGFR 82 >57 QI/ONG/2.95   BUN/Creatinine Ratio 17 12 - 28   Sodium 135 134 - 144 mmol/L   Potassium 4.4 3.5 - 5.2 mmol/L   Chloride 99 96 - 106 mmol/L   CO2 20 20 - 29 mmol/L   Calcium 9.6 8.7 - 10.3 mg/dL   Total Protein 7.1 6.0 - 8.5 g/dL   Albumin 4.3 3.8 - 4.9 g/dL   Globulin,  Total 2.8 1.5 - 4.5 g/dL   Bilirubin Total 0.3 0.0 - 1.2 mg/dL   Alkaline Phosphatase 104 44 - 121 IU/L   AST 14 0 - 40 IU/L   ALT 9 0 - 32 IU/L  Pertinent labs & imaging results that were available during my care of the patient were reviewed by me and considered in my medical decision making.  Assessment & Plan:  Jenny Willis "Jenny Willis" was seen today for medical management of chronic issues.  Diagnoses and all orders for this visit:  Type 2 diabetes mellitus with other specified complication, without long-term current use of insulin  (HCC) -     tirzepatide  (MOUNJARO ) 5 MG/0.5ML Pen; Inject 5 mg into the skin once a week. -     Ambulatory referral to Podiatry  Hyperlipidemia associated with type 2 diabetes mellitus (HCC) -     tirzepatide  (MOUNJARO ) 5 MG/0.5ML Pen; Inject 5 mg into the skin once a week.  Hypertension associated with diabetes (HCC) -     atenolol  (TENORMIN ) 25 MG tablet; Take 1 tablet (25 mg total) by mouth at bedtime. -     tirzepatide  (MOUNJARO ) 5 MG/0.5ML Pen; Inject 5 mg into the skin once a week.  Vitamin D  deficiency Continue repletion therapy.   Acquired hypothyroidism Continue thyroid  supplements   Acquired dilation of ascending aorta and aortic root (HCC) Has follow up with cardiology scheduled.   BMI 29.0-29.9,adult Continue diet, exercise, and medications   Diabetic mononeuropathy associated with type 2 diabetes mellitus (HCC) Continue medications  Need for prophylactic vaccination against viral disease -     Tdap vaccine greater than or equal to 7yo IM -     Varicella-zoster vaccine IM  Encounter for screening mammogram for malignant neoplasm of breast -     MM 3D SCREENING MAMMOGRAM BILATERAL BREAST  S/P total hysterectomy Aware PAP is no longer needed.        Type 2 Diabetes Mellitus Type 2 diabetes mellitus with recent A1c of 6.2%, indicating good glycemic control. She has not been taking Mounjaro  or Januvia  consistently. Reports  adverse effects from higher doses of Mounjaro , including nausea and vomiting, and neuropathy symptoms with Januvia . Desires to restart Mounjaro  at a lower dose to aid in weight loss and manage nighttime eating issues. - Prescribe Mounjaro  5 mg weekly - Discontinue Januvia  - Monitor blood glucose levels regularly - Schedule follow-up in three months for diabetes management  Hypertension Blood pressure readings at home are well-controlled, ranging from 112 to 125 mmHg. Elevated blood pressure during the visit, likely due to pain and missed medication dose. - Continue current antihypertensive regimen - Monitor blood pressure at home regularly - Report any significant increases in blood pressure  Chronic Pain Chronic pain, possibly related to a pinched nerve, managed with Flexeril  as needed. Reports drowsiness with Flexeril  if not active. Considering Baclofen as an alternative muscle relaxant, which may cause less drowsiness. - Consider trial of Baclofen as an alternative muscle relaxant - Continue Flexeril  as needed for pain management - Evaluate pain management strategies during follow-up  Hyperlipidemia Cholesterol levels are slightly elevated but not significantly concerning at this time. - Continue current management - Reassess lipid levels during next follow-up  General Health Maintenance Due for routine health screenings and vaccinations. No Pap smear needed due to total hysterectomy for cancerous cells. Due for mammogram and eye exam. - Order mammogram - Refer to podiatrist Dr. Abbey Abbe - Provide information for eye exam options - Administer vaccines during the visit       Total time spent with patient today was 42 minutes, this time was spent reviewing prior charts, labs, x-rays, discussing plan of care, and documenting the encounter.     Continue all other maintenance medications.  Follow  up plan: Return in about 3 months (around 04/21/2024), or if symptoms worsen or fail to  improve, for 3 months for DM, 6 months for CPE.   Continue healthy lifestyle choices, including diet (rich in fruits, vegetables, and lean proteins, and low in salt and simple carbohydrates) and exercise (at least 30 minutes of moderate physical activity daily).  Educational handout given for DM  The above assessment and management plan was discussed with the patient. The patient verbalized understanding of and has agreed to the management plan. Patient is aware to call the clinic if they develop any new symptoms or if symptoms persist or worsen. Patient is aware when to return to the clinic for a follow-up visit. Patient educated on when it is appropriate to go to the emergency department.   Kattie Parrot, FNP-C [facility] [phone]

## 2024-01-20 NOTE — Patient Instructions (Signed)

## 2024-01-25 DIAGNOSIS — M9902 Segmental and somatic dysfunction of thoracic region: Secondary | ICD-10-CM | POA: Diagnosis not present

## 2024-01-25 DIAGNOSIS — M62830 Muscle spasm of back: Secondary | ICD-10-CM | POA: Diagnosis not present

## 2024-01-25 DIAGNOSIS — M9903 Segmental and somatic dysfunction of lumbar region: Secondary | ICD-10-CM | POA: Diagnosis not present

## 2024-01-25 DIAGNOSIS — M9901 Segmental and somatic dysfunction of cervical region: Secondary | ICD-10-CM | POA: Diagnosis not present

## 2024-01-26 DIAGNOSIS — M9903 Segmental and somatic dysfunction of lumbar region: Secondary | ICD-10-CM | POA: Diagnosis not present

## 2024-01-26 DIAGNOSIS — M9902 Segmental and somatic dysfunction of thoracic region: Secondary | ICD-10-CM | POA: Diagnosis not present

## 2024-01-26 DIAGNOSIS — M62830 Muscle spasm of back: Secondary | ICD-10-CM | POA: Diagnosis not present

## 2024-01-26 DIAGNOSIS — M9901 Segmental and somatic dysfunction of cervical region: Secondary | ICD-10-CM | POA: Diagnosis not present

## 2024-01-27 DIAGNOSIS — M461 Sacroiliitis, not elsewhere classified: Secondary | ICD-10-CM | POA: Diagnosis not present

## 2024-01-31 DIAGNOSIS — H25813 Combined forms of age-related cataract, bilateral: Secondary | ICD-10-CM | POA: Diagnosis not present

## 2024-01-31 DIAGNOSIS — M25512 Pain in left shoulder: Secondary | ICD-10-CM | POA: Diagnosis not present

## 2024-01-31 DIAGNOSIS — M9903 Segmental and somatic dysfunction of lumbar region: Secondary | ICD-10-CM | POA: Diagnosis not present

## 2024-01-31 DIAGNOSIS — E113293 Type 2 diabetes mellitus with mild nonproliferative diabetic retinopathy without macular edema, bilateral: Secondary | ICD-10-CM | POA: Diagnosis not present

## 2024-01-31 DIAGNOSIS — M7552 Bursitis of left shoulder: Secondary | ICD-10-CM | POA: Diagnosis not present

## 2024-01-31 DIAGNOSIS — M19012 Primary osteoarthritis, left shoulder: Secondary | ICD-10-CM | POA: Diagnosis not present

## 2024-01-31 DIAGNOSIS — M62830 Muscle spasm of back: Secondary | ICD-10-CM | POA: Diagnosis not present

## 2024-01-31 DIAGNOSIS — M67814 Other specified disorders of tendon, left shoulder: Secondary | ICD-10-CM | POA: Diagnosis not present

## 2024-01-31 DIAGNOSIS — M9902 Segmental and somatic dysfunction of thoracic region: Secondary | ICD-10-CM | POA: Diagnosis not present

## 2024-01-31 DIAGNOSIS — M9901 Segmental and somatic dysfunction of cervical region: Secondary | ICD-10-CM | POA: Diagnosis not present

## 2024-02-02 DIAGNOSIS — M9901 Segmental and somatic dysfunction of cervical region: Secondary | ICD-10-CM | POA: Diagnosis not present

## 2024-02-02 DIAGNOSIS — M9903 Segmental and somatic dysfunction of lumbar region: Secondary | ICD-10-CM | POA: Diagnosis not present

## 2024-02-02 DIAGNOSIS — M62830 Muscle spasm of back: Secondary | ICD-10-CM | POA: Diagnosis not present

## 2024-02-02 DIAGNOSIS — M9902 Segmental and somatic dysfunction of thoracic region: Secondary | ICD-10-CM | POA: Diagnosis not present

## 2024-02-03 DIAGNOSIS — M25512 Pain in left shoulder: Secondary | ICD-10-CM | POA: Diagnosis not present

## 2024-02-03 DIAGNOSIS — G8929 Other chronic pain: Secondary | ICD-10-CM | POA: Diagnosis not present

## 2024-02-03 DIAGNOSIS — M461 Sacroiliitis, not elsewhere classified: Secondary | ICD-10-CM | POA: Diagnosis not present

## 2024-02-08 ENCOUNTER — Encounter: Payer: Self-pay | Admitting: Physician Assistant

## 2024-02-08 ENCOUNTER — Ambulatory Visit: Payer: Self-pay

## 2024-02-08 ENCOUNTER — Telehealth: Payer: Medicare (Managed Care) | Admitting: Physician Assistant

## 2024-02-08 DIAGNOSIS — R52 Pain, unspecified: Secondary | ICD-10-CM | POA: Diagnosis not present

## 2024-02-08 DIAGNOSIS — R6889 Other general symptoms and signs: Secondary | ICD-10-CM | POA: Diagnosis not present

## 2024-02-08 MED ORDER — COVID-19 FLU A&B 3-IN-1 TEST VI KIT: 1.0000 | PACK | Freq: Once | 0 refills | Status: AC

## 2024-02-08 NOTE — Telephone Encounter (Signed)
Pt has telehealth appt

## 2024-02-08 NOTE — Progress Notes (Signed)
 Virtual Visit Consent   Jenny Willis, you are scheduled for a virtual visit with a [facility] provider today. Just as with appointments in the office, your consent must be obtained to participate. Your consent will be active for this visit and any virtual visit you may have with one of our providers in the next 365 days. If you have a MyChart account, a copy of this consent can be sent to you electronically.  As this is a virtual visit, video technology does not allow for your provider to perform a traditional examination. This may limit your provider's ability to fully assess your condition. If your provider identifies any concerns that need to be evaluated in person or the need to arrange testing (such as labs, EKG, etc.), we will make arrangements to do so. Although advances in technology are sophisticated, we cannot ensure that it will always work on either your end or our end. If the connection with a video visit is poor, the visit may have to be switched to a telephone visit. With either a video or telephone visit, we are not always able to ensure that we have a secure connection.  By engaging in this virtual visit, you consent to the provision of healthcare and authorize for your insurance to be billed (if applicable) for the services provided during this visit. Depending on your insurance coverage, you may receive a charge related to this service.  I need to obtain your verbal consent now. Are you willing to proceed with your visit today? Jenny Willis has provided verbal consent on 02/08/2024 for a virtual visit (video or telephone). Jenny Willis, [place]  Date: 02/08/2024 9:50 AM   Virtual Visit via Video Note   I, Jenny Willis, connected with  Jenny Willis  ([id number], 1965/11/03) on 02/08/24 at  9:30 AM EDT by a video-enabled telemedicine application and verified that I am speaking with the correct person using two identifiers.  Location: Patient: Virtual Visit  Location Patient: Home Provider: Virtual Visit Location Provider: Home Office   I discussed the limitations of evaluation and management by telemedicine and the availability of in person appointments. The patient expressed understanding and agreed to proceed.    History of Present Illness: Jenny Willis is a 58 y.o. who identifies as a female who was assigned female at birth, and is being seen today for onset of  body aches, fever, chills, nausea with one episode of  dry heaves, starting Sunday night into Sunday morning. Notes anorexia. Denies substantial cough or chest congestion. Has taken Pepto Bismol and Ibuprofen OTC.  Denies recent travel. Some family members sick last week but no known exposure to flu/COVID.    HPI: HPI  Problems:  Patient Active Problem List   Diagnosis Date Noted   BMI 29.0-29.9,adult 01/20/2024   Diabetic neuropathy associated with type 2 diabetes mellitus (HCC) 01/20/2024   S/P total hysterectomy 01/20/2024   Acquired dilation of ascending aorta and aortic root (HCC) 07/21/2022   Diabetes mellitus (HCC) 01/09/2022   Hypertension associated with diabetes (HCC) 01/09/2022   Hyperlipidemia associated with type 2 diabetes mellitus (HCC) 01/09/2022   Acquired hypothyroidism 01/09/2022   Vitamin D  deficiency 01/09/2022   Ascending aortic aneurysm (HCC) 10/11/2020    Allergies:  Allergies  Allergen Reactions   Gabapentin Shortness Of Breath   Sulfa Antibiotics Hives    REACTION: GI upset   Nitrofurantoin Macrocrystal Other (See Comments)    Muscle spasms    Codeine  Metformin     REACTION: diarrhea   Penicillins     REACTION: childhood reaction wtih Scarlett Fever   Sulfonamide Derivatives     REACTION: GI upset   Lisinopril Other (See Comments)   Medications:  Current Outpatient Medications:    Influenza-SARS At Home Test (COVID-19 FLU A&B 3-IN-1 TEST) KIT, 1 kit by In Vitro route once for 1 dose., Disp: 1 kit, Rfl: 0   albuterol  (VENTOLIN  HFA)  108 (90 Base) MCG/ACT inhaler, INHALE 2 PUFFS INTO THE LUNGS EVERY 6 HOURS AS NEEDED FOR WHEEZING OR SHORTNESS OF BREATH, Disp: 6.7 each, Rfl: 1   aspirin 81 MG chewable tablet, Chew by mouth. (Patient not taking: Reported on 01/20/2024), Disp: , Rfl:    atenolol  (TENORMIN ) 25 MG tablet, Take 1 tablet (25 mg total) by mouth at bedtime., Disp: 90 tablet, Rfl: 1   butalbital-acetaminophen-caffeine (FIORICET) 50-325-40 MG tablet, Take 1 tablet by mouth 2 (two) times daily. (Patient not taking: Reported on 01/20/2024), Disp: , Rfl:    cetirizine (ZYRTEC) 10 MG tablet, Take by mouth., Disp: , Rfl:    cyclobenzaprine  (FLEXERIL ) 10 MG tablet, TAKE 1 TABLET BY MOUTH THREE TIMES A DAY AS NEEDED FOR MUSCLE SPASM, Disp: 90 tablet, Rfl: 0   fluticasone  (FLONASE ) 50 MCG/ACT nasal spray, Place two sprays in each nostril once daily, Disp: 16 g, Rfl: 1   MELATONIN PO, Take by mouth., Disp: , Rfl:    NON FORMULARY, OmegaCo3, Disp: , Rfl:    NON FORMULARY, Liqua-D (K-87), Disp: , Rfl:    OVER THE COUNTER MEDICATION, Thyroid  support, Disp: , Rfl:    OVER THE COUNTER MEDICATION, Thyroid  support, Disp: , Rfl:    pregabalin (LYRICA) 25 MG capsule, Take 25 mg by mouth 2 (two) times daily., Disp: , Rfl:    Red Yeast Rice 600 MG CAPS, Take 2,400 mg by mouth daily., Disp: , Rfl:    SUMAtriptan  (IMITREX ) 100 MG tablet, TAKE 1 TABLET AT ONSET OF MIGRAINE. REPEAT ONLY IF NEEDED IN 2 HOURS. NO MORE THAN 2 IN 24 HOURS, Disp: 9 tablet, Rfl: 0   tirzepatide  (MOUNJARO ) 5 MG/0.5ML Pen, Inject 5 mg into the skin once a week., Disp: 6 mL, Rfl: 6  Observations/Objective: Patient is well-developed, well-nourished in no acute distress.  Resting comfortably  at home.  Head is normocephalic, atraumatic.  No labored breathing.  Speech is clear and coherent with logical content.  Patient is alert and oriented at baseline.   Assessment and Plan: 1. Flu-like symptoms (Primary) - Influenza-SARS At Home Test (COVID-19 FLU A&B 3-IN-1 TEST)  KIT; 1 kit by In Vitro route once for 1 dose.  Dispense: 1 kit; Refill: 0  Concern for COVID versus flu even outside of flu-season giving classic symptomology. Will have her take home COVID/flu test to further differentiate. Supportive measures and OTC medications reviewed. Will add on appropriate antiviral based on testing results.   Follow Up Instructions: I discussed the assessment and treatment plan with the patient. The patient was provided an opportunity to ask questions and all were answered. The patient agreed with the plan and demonstrated an understanding of the instructions.  A copy of instructions were sent to the patient via MyChart unless otherwise noted below.   The patient was advised to call back or seek an in-person evaluation if the symptoms worsen or if the condition fails to improve as anticipated.    Jenny Velma Lunger, PA-C

## 2024-02-08 NOTE — Telephone Encounter (Signed)
 FYI Only or Action Required?: FYI only for provider.  Patient was last seen in primary care on 01/20/2024 by Jenny Rock HERO, FNP. Called Nurse Triage reporting Chills, Fever, Fatigue, Generalized Body Aches, and Vomiting. Symptoms began several days ago. Interventions attempted: OTC medications: Tylenol, Zyrtec, Pepto Bismol and Rest, hydration, or home remedies. Symptoms are: severe body aches, nasal congestion, nausea, vomiting x 1 episode, decreased appetite, chills gradually worsening.  Triage Disposition: See PCP When Office is Open (Within 3 Days)  Patient/caregiver understands and will follow disposition?: Yes                       Copied from CRM [id number]. Topic: Clinical - Red Word Triage >> Feb 08, 2024  8:04 AM Essie A wrote: Red Word that prompted transfer to Nurse Triage: body aches and pain, chills, possible fever, no appetite, can't get out of bed. All started 4 days ago. Reason for Disposition  [1] Nasal discharge AND [2] present > 10 days  Answer Assessment - Initial Assessment Questions Patient states her BP this morning is 157/95 and heart rate 96. Patient states although she can not eat much right now, she is drinking plenty of water. Patient denies diarrhea, loss of taste or smell, exposure to flu or COVID.  1. ONSET: When did the nasal discharge start?      Every day, worsened x few days.  2. AMOUNT: How much discharge is there?      Moderate.  3. COUGH: Do you have a cough? If Yes, ask: Describe the color of your sputum (clear, white, yellow, green)     No.  4. RESPIRATORY DISTRESS: Describe your breathing.      Minor SOB when she states she gets a hot flash. She states it has not been severe enough to have to use her inhaler. Patient speaking in full sentences, no wheezing or labored breathing.  5. FEVER: Do you have a fever? If Yes, ask: What is your temperature, how was it measured, and when did it start?     Unsure, patient  did not check her temperature. She states she felt like she had a fever.  6. SEVERITY: Overall, how bad are you feeling right now? (e.g., doesn't interfere with normal activities, staying home from school/work, staying in bed)      Staying in bed most of the day. She states she was not able to eat much (over the past few days has only had saltines, soup and a PB&J)  7. OTHER SYMPTOMS: Do you have any other symptoms? (e.g., sore throat, earache, wheezing, vomiting)     Nasal congestion, body aches, nausea and vomiting x 1 episode, chills, decreased appetite.  8. PREGNANCY: Is there any chance you are pregnant? When was your last menstrual period?     N/A.  Protocols used: Common Cold-A-AH

## 2024-02-08 NOTE — Patient Instructions (Signed)
 Jenny Willis, thank you for joining Jenny Velma Lunger, PA-C for today's virtual visit.  While this provider is not your primary care provider (PCP), if your PCP is located in our provider database this encounter information will be shared with them immediately following your visit.   A [facility] MyChart account gives you access to today's visit and all your visits, tests, and labs performed at [facility]  click here if you don't have a [facility] MyChart account or go to [email or web address]  Consent: (Patient) Jenny Willis provided verbal consent for this virtual visit at the beginning of the encounter.  Current Medications:  Current Outpatient Medications:    albuterol  (VENTOLIN  HFA) 108 (90 Base) MCG/ACT inhaler, INHALE 2 PUFFS INTO THE LUNGS EVERY 6 HOURS AS NEEDED FOR WHEEZING OR SHORTNESS OF BREATH, Disp: 6.7 each, Rfl: 1   aspirin 81 MG chewable tablet, Chew by mouth. (Patient not taking: Reported on 01/20/2024), Disp: , Rfl:    atenolol  (TENORMIN ) 25 MG tablet, Take 1 tablet (25 mg total) by mouth at bedtime., Disp: 90 tablet, Rfl: 1   butalbital-acetaminophen-caffeine (FIORICET) 50-325-40 MG tablet, Take 1 tablet by mouth 2 (two) times daily. (Patient not taking: Reported on 01/20/2024), Disp: , Rfl:    cetirizine (ZYRTEC) 10 MG tablet, Take by mouth., Disp: , Rfl:    cyclobenzaprine  (FLEXERIL ) 10 MG tablet, TAKE 1 TABLET BY MOUTH THREE TIMES A DAY AS NEEDED FOR MUSCLE SPASM, Disp: 90 tablet, Rfl: 0   fluticasone  (FLONASE ) 50 MCG/ACT nasal spray, Place two sprays in each nostril once daily, Disp: 16 g, Rfl: 1   MELATONIN PO, Take by mouth., Disp: , Rfl:    NON FORMULARY, OmegaCo3, Disp: , Rfl:    NON FORMULARY, Liqua-D (K-87), Disp: , Rfl:    OVER THE COUNTER MEDICATION, Thyroid  support, Disp: , Rfl:    OVER THE COUNTER MEDICATION, Thyroid  support, Disp: , Rfl:    pregabalin (LYRICA) 25 MG capsule, Take 25 mg by mouth 2 (two) times daily., Disp: , Rfl:     Red Yeast Rice 600 MG CAPS, Take 2,400 mg by mouth daily., Disp: , Rfl:    SUMAtriptan  (IMITREX ) 100 MG tablet, TAKE 1 TABLET AT ONSET OF MIGRAINE. REPEAT ONLY IF NEEDED IN 2 HOURS. NO MORE THAN 2 IN 24 HOURS, Disp: 9 tablet, Rfl: 0   tirzepatide  (MOUNJARO ) 5 MG/0.5ML Pen, Inject 5 mg into the skin once a week., Disp: 6 mL, Rfl: 6   Medications ordered in this encounter:  No orders of the defined types were placed in this encounter.    *If you need refills on other medications prior to your next appointment, please contact your pharmacy*  Follow-Up: Call back or seek an in-person evaluation if the symptoms worsen or if the condition fails to improve as anticipated.  [facility] [phone]  Other Instructions Please take the home test and message back ASAP with the results so we can finalize your treatment plan to get you feeling better! Talk soon!   If you have been instructed to have an in-person evaluation today at a local Urgent Care facility, please use the link below. It will take you to a list of all of our available [facility], including address, phone number and hours of operation. Please do not delay care.  [facility]  If you or a family member do not have a primary care provider, use the link below to schedule a visit and establish  care. When you choose a [facility] primary care physician or advanced practice provider, you gain a long-term partner in health. Find a Primary Care Provider  Learn more about [facility]'s in-office and virtual care options:  - Get Care Now

## 2024-02-09 DIAGNOSIS — Z20822 Contact with and (suspected) exposure to covid-19: Secondary | ICD-10-CM | POA: Diagnosis not present

## 2024-02-09 DIAGNOSIS — B349 Viral infection, unspecified: Secondary | ICD-10-CM | POA: Diagnosis not present

## 2024-02-09 DIAGNOSIS — R5383 Other fatigue: Secondary | ICD-10-CM | POA: Diagnosis not present

## 2024-02-09 DIAGNOSIS — Z87891 Personal history of nicotine dependence: Secondary | ICD-10-CM | POA: Diagnosis not present

## 2024-02-09 DIAGNOSIS — R0602 Shortness of breath: Secondary | ICD-10-CM | POA: Diagnosis not present

## 2024-02-09 MED ORDER — ONDANSETRON 4 MG PO TBDP
4.0000 mg | ORAL_TABLET | Freq: Three times a day (TID) | ORAL | 0 refills | Status: AC | PRN
Start: 2024-02-09 — End: ?

## 2024-02-09 NOTE — Addendum Note (Signed)
 Addended by: GLADIS ELSIE BROCKS on: 02/09/2024 10:47 AM   Modules accepted: Orders

## 2024-02-10 DIAGNOSIS — R0602 Shortness of breath: Secondary | ICD-10-CM | POA: Diagnosis not present

## 2024-02-15 DIAGNOSIS — M7542 Impingement syndrome of left shoulder: Secondary | ICD-10-CM | POA: Diagnosis not present

## 2024-02-15 DIAGNOSIS — M25512 Pain in left shoulder: Secondary | ICD-10-CM | POA: Diagnosis not present

## 2024-02-15 DIAGNOSIS — M19012 Primary osteoarthritis, left shoulder: Secondary | ICD-10-CM | POA: Diagnosis not present

## 2024-02-22 ENCOUNTER — Ambulatory Visit (INDEPENDENT_AMBULATORY_CARE_PROVIDER_SITE_OTHER): Payer: Medicare (Managed Care) | Admitting: Family Medicine

## 2024-02-22 ENCOUNTER — Encounter: Payer: Self-pay | Admitting: Family Medicine

## 2024-02-22 VITALS — BP 132/85 | HR 126 | Temp 97.7°F | Ht 67.0 in | Wt 179.8 lb

## 2024-02-22 DIAGNOSIS — R7989 Other specified abnormal findings of blood chemistry: Secondary | ICD-10-CM | POA: Diagnosis not present

## 2024-02-22 DIAGNOSIS — R5383 Other fatigue: Secondary | ICD-10-CM

## 2024-02-22 DIAGNOSIS — M791 Myalgia, unspecified site: Secondary | ICD-10-CM

## 2024-02-22 DIAGNOSIS — E860 Dehydration: Secondary | ICD-10-CM | POA: Diagnosis not present

## 2024-02-22 DIAGNOSIS — R61 Generalized hyperhidrosis: Secondary | ICD-10-CM

## 2024-02-22 DIAGNOSIS — R5381 Other malaise: Secondary | ICD-10-CM | POA: Diagnosis not present

## 2024-02-22 DIAGNOSIS — Z8669 Personal history of other diseases of the nervous system and sense organs: Secondary | ICD-10-CM | POA: Diagnosis not present

## 2024-02-22 DIAGNOSIS — R0609 Other forms of dyspnea: Secondary | ICD-10-CM | POA: Diagnosis not present

## 2024-02-22 LAB — URINALYSIS, ROUTINE W REFLEX MICROSCOPIC
Bilirubin, UA: NEGATIVE
Glucose, UA: NEGATIVE
Leukocytes,UA: NEGATIVE
Nitrite, UA: NEGATIVE
Specific Gravity, UA: 1.02 (ref 1.005–1.030)
Urobilinogen, Ur: 0.2 mg/dL (ref 0.2–1.0)
pH, UA: 5.5 (ref 5.0–7.5)

## 2024-02-22 LAB — MICROSCOPIC EXAMINATION
Renal Epithel, UA: NONE SEEN /HPF
Yeast, UA: NONE SEEN

## 2024-02-22 MED ORDER — AIRSUPRA 90-80 MCG/ACT IN AERO
2.0000 | INHALATION_SPRAY | RESPIRATORY_TRACT | 11 refills | Status: AC | PRN
Start: 2024-02-22 — End: ?

## 2024-02-22 MED ORDER — SUMATRIPTAN SUCCINATE 100 MG PO TABS: 100.0000 mg | ORAL_TABLET | Freq: Once | ORAL | 11 refills | Status: AC

## 2024-02-22 NOTE — Progress Notes (Signed)
 Subjective:  Patient ID: Jenny Willis, female    DOB: 1965-08-29, 58 y.o.   MRN: [id number]  Patient Care Team: Severa Rock HERO, FNP as PCP - General (Family Medicine)   Chief Complaint:  ER follow up  (02/09/2024/[facility] Emergency Department- viral illness. States she is no better- still having body aches, nausea, cold sweats, & night sweats )   HPI: Jenny Willis is a 58 y.o. female presenting on 02/22/2024 for ER follow up  (02/09/2024/[facility] Emergency Department- viral illness. States she is no better- still having body aches, nausea, cold sweats, & night sweats )  Jenny Willis is a 58 year old female who presents with body aches, night sweats, and fluctuating temperatures.  Constitutional symptoms - Body aches since late June, initially localized and now diffuse - Night sweats and day sweats with frequent clothing changes at night due to excessive sweating - Fluctuating temperatures with sensations of both heat and cold, requiring simultaneous use of fan, AC, heating blanket, and heating pad - Episodes of fever and nausea - Temporary relief of symptoms after IV fluids in the ER  Infectious symptoms and evaluation - Evaluation in ER included EKGs and laboratory testing - Diagnosed with a viral infection in the ER - Urine appeared very dark, but no dehydration was identified - Abnormal laboratory findings included elevated liver enzymes, elevated creatinine, and low platelet count  Hypertension - History of hypertension - Blood pressure spiked to 220, reduced to 185 with use of antihypertensive medication - Has not taken antihypertensive medication since the episode - Concern about blood pressure control  Glycemic control - Blood sugar fluctuations with a recent high of 324 after eating cereal - Took Januvia  after elevated blood sugar, but has not been taking it regularly - Diet when unwell consists of [employer] spaghetti and meatballs and ice  cream  Menopausal symptoms - Inquires about menopause as a possible cause of symptoms - Previously had a test for menopause  Gastrointestinal and ophthalmologic procedures - Colonoscopy postponed due to current illness and upcoming vacation - Cataracts in both eyes with planned surgery, now delayed          Relevant past medical, surgical, family, and social history reviewed and updated as indicated.  Allergies and medications reviewed and updated. Data reviewed: Chart in Epic.   Past Medical History:  Diagnosis Date   Diabetes mellitus without complication (HCC)    Hyperlipidemia    Hypertension    Thyroid  disease     Past Surgical History:  Procedure Laterality Date   ABDOMINAL HYSTERECTOMY     BREAST BIOPSY Left    CYSTECTOMY Left     Social History   Socioeconomic History   Marital status: Married    Spouse name: Not on file   Number of children: Not on file   Years of education: Not on file   Highest education level: Not on file  Occupational History   Not on file  Tobacco Use   Smoking status: Former   Smokeless tobacco: Never  Substance and Sexual Activity   Alcohol use: No   Drug use: No   Sexual activity: Yes  Other Topics Concern   Not on file  Social History Narrative   Not on file   Social Drivers of Health   Financial Resource Strain: Low Risk  (10/01/2021)   Received from [facility]   Overall Financial Resource Strain (CARDIA)    Difficulty of Paying Living Expenses: Not hard  at all  Food Insecurity: No Food Insecurity (10/01/2021)   Received from [facility]   Hunger Vital Sign    Within the past 12 months, you worried that your food would run out before you got the money to buy more.: Never true    Within the past 12 months, the food you bought just didn't last and you didn't have money to get more.: Never true  Transportation Needs: No Transportation Needs (10/01/2021)   Received from [facility]   PRAPARE - Transportation     Lack of Transportation (Medical): No    Lack of Transportation (Non-Medical): No  Physical Activity: Insufficiently Active (10/01/2021)   Received from [facility]   Exercise Vital Sign    On average, how many days per week do you engage in moderate to strenuous exercise (like a brisk walk)?: 3 days    On average, how many minutes do you engage in exercise at this level?: 10 min  Stress: No Stress Concern Present (10/01/2021)   Received from [facility] - Occupational Stress Questionnaire    Feeling of Stress : Not at all  Social Connections: Unknown (12/15/2021)   Received from [facility]   Social Network    Social Network: Not on file  Intimate Partner Violence: Not At Risk (02/09/2024)   Received from [facility]   HITS    Over the last 12 months how often did your partner physically hurt you?: Never    Over the last 12 months how often did your partner insult you or talk down to you?: Never    Over the last 12 months how often did your partner threaten you with physical harm?: Never    Over the last 12 months how often did your partner scream or curse at you?: Never    Outpatient Encounter Medications as of 02/22/2024  Medication Sig   Albuterol -Budesonide (AIRSUPRA ) 90-80 MCG/ACT AERO Inhale 2 puffs into the lungs every 4 (four) hours as needed.   atenolol  (TENORMIN ) 25 MG tablet Take 1 tablet (25 mg total) by mouth at bedtime.   butalbital-acetaminophen-caffeine (FIORICET) 50-325-40 MG tablet Take 1 tablet by mouth 2 (two) times daily.   cetirizine (ZYRTEC) 10 MG tablet Take by mouth.   cyclobenzaprine  (FLEXERIL ) 10 MG tablet TAKE 1 TABLET BY MOUTH THREE TIMES A DAY AS NEEDED FOR MUSCLE SPASM   fluticasone  (FLONASE ) 50 MCG/ACT nasal spray Place two sprays in each nostril once daily   MELATONIN PO Take by mouth.   NON FORMULARY OmegaCo3   NON FORMULARY Liqua-D (K-87)   ondansetron  (ZOFRAN -ODT) 4 MG disintegrating tablet Take 1  tablet (4 mg total) by mouth every 8 (eight) hours as needed for nausea or vomiting.   OVER THE COUNTER MEDICATION Thyroid  support   OVER THE COUNTER MEDICATION Thyroid  support   pregabalin (LYRICA) 25 MG capsule Take 25 mg by mouth 2 (two) times daily.   Red Yeast Rice 600 MG CAPS Take 2,400 mg by mouth daily.   tirzepatide  (MOUNJARO ) 5 MG/0.5ML Pen Inject 5 mg into the skin once a week.   [DISCONTINUED] albuterol  (VENTOLIN  HFA) 108 (90 Base) MCG/ACT inhaler INHALE 2 PUFFS INTO THE LUNGS EVERY 6 HOURS AS NEEDED FOR WHEEZING OR SHORTNESS OF BREATH   [DISCONTINUED] SUMAtriptan  (IMITREX ) 100 MG tablet TAKE 1 TABLET AT ONSET OF MIGRAINE. REPEAT ONLY IF NEEDED IN 2 HOURS. NO MORE THAN 2 IN 24 HOURS   aspirin 81 MG chewable tablet Chew by mouth. (  Patient not taking: Reported on 02/22/2024)   SUMAtriptan  (IMITREX ) 100 MG tablet Take 1 tablet (100 mg total) by mouth once for 1 dose. May repeat in 2 hours if headache persists or recurs.   No facility-administered encounter medications on file as of 02/22/2024.    Allergies  Allergen Reactions   Gabapentin Shortness Of Breath   Misc. Sulfonamide Containing Compounds Hives    REACTION: GI upset   Penicillins Hives    REACTION: childhood reaction wtih Scarlett Fever  Other reaction(s): Other (See Comments) unrecalled REACTION: childhood reaction wtih Scarlett Fever REACTION: childhood reaction wtih Scarlett Fever   Sulfa Antibiotics Hives    REACTION: GI upset   Nitrofurantoin Macrocrystal Other (See Comments)    Muscle spasms  Other reaction(s): Other, Other (See Comments) Muscle spasms  Muscle spasms   Codeine    Metformin     REACTION: diarrhea   Sulfonamide Derivatives     REACTION: GI upset   Lisinopril Other (See Comments)    Pertinent ROS per HPI, otherwise unremarkable      Objective:  BP 132/85   Pulse (!) 126   Temp 97.7 F (36.5 C)   Ht 5' 7 (1.702 m)   Wt 179 lb 12.8 oz (81.6 kg)   SpO2 95%   BMI 28.16 kg/m    Wt  Readings from Last 3 Encounters:  02/22/24 179 lb 12.8 oz (81.6 kg)  01/20/24 190 lb 12.8 oz (86.5 kg)  03/17/23 203 lb 9.6 oz (92.4 kg)    Physical Exam Vitals and nursing note reviewed.  Constitutional:      General: She is not in acute distress.    Appearance: Normal appearance. She is well-developed, well-groomed and overweight. She is not ill-appearing, toxic-appearing or diaphoretic.  HENT:     Head: Normocephalic and atraumatic.     Jaw: There is normal jaw occlusion.     Right Ear: Hearing normal.     Left Ear: Hearing normal.     Nose: Nose normal.     Mouth/Throat:     Lips: Pink.     Mouth: Mucous membranes are moist.     Pharynx: Oropharynx is clear. Uvula midline.  Eyes:     General: Lids are normal.     Extraocular Movements: Extraocular movements intact.     Conjunctiva/sclera: Conjunctivae normal.     Pupils: Pupils are equal, round, and reactive to light.  Neck:     Thyroid : No thyroid  mass, thyromegaly or thyroid  tenderness.     Vascular: No carotid bruit or JVD.     Trachea: Trachea and phonation normal.  Cardiovascular:     Rate and Rhythm: Normal rate and regular rhythm.     Chest Wall: PMI is not displaced.     Pulses: Normal pulses.     Heart sounds: Normal heart sounds. No murmur heard.    No friction rub. No gallop.  Pulmonary:     Effort: Pulmonary effort is normal. No respiratory distress.     Breath sounds: Normal breath sounds. No wheezing.  Abdominal:     General: Bowel sounds are normal. There is no distension or abdominal bruit.     Palpations: Abdomen is soft. There is no hepatomegaly or splenomegaly.     Tenderness: There is no abdominal tenderness. There is no right CVA tenderness or left CVA tenderness.     Hernia: No hernia is present.  Musculoskeletal:        General: Normal range of motion.  Cervical back: Normal range of motion and neck supple.     Right lower leg: No edema.     Left lower leg: No edema.  Lymphadenopathy:      Cervical: No cervical adenopathy.  Skin:    General: Skin is warm and dry.     Capillary Refill: Capillary refill takes less than 2 seconds.     Coloration: Skin is not cyanotic, jaundiced or pale.     Findings: No rash.  Neurological:     General: No focal deficit present.     Mental Status: She is alert and oriented to person, place, and time.     Sensory: Sensation is intact.     Motor: Motor function is intact.     Coordination: Coordination is intact.     Gait: Gait is intact.     Deep Tendon Reflexes: Reflexes are normal and symmetric.  Psychiatric:        Attention and Perception: Attention and perception normal.        Mood and Affect: Mood and affect normal.        Speech: Speech normal.        Behavior: Behavior normal. Behavior is cooperative.        Thought Content: Thought content normal.        Cognition and Memory: Cognition and memory normal.        Judgment: Judgment normal.     Results for orders placed or performed in visit on 02/22/24  Microscopic Examination   Collection Time: 02/22/24  3:08 PM   Urine  Result Value Ref Range   WBC, UA 0-5 0 - 5 /hpf   RBC, Urine 3-10 (A) 0 - 2 /hpf   Epithelial Cells (non renal) 0-10 0 - 10 /hpf   Renal Epithel, UA None seen None seen /hpf   Mucus, UA Present (A) Not Estab.   Bacteria, UA Few (A) None seen/Few   Yeast, UA None seen None seen  Urinalysis, Routine w reflex microscopic   Collection Time: 02/22/24  3:08 PM  Result Value Ref Range   Specific Gravity, UA 1.020 1.005 - 1.030   pH, UA 5.5 5.0 - 7.5   Color, UA Yellow Yellow   Appearance Ur Clear Clear   Leukocytes,UA Negative Negative   Protein,UA 2+ (A) Negative/Trace   Glucose, UA Negative Negative   Ketones, UA Trace (A) Negative   RBC, UA 2+ (A) Negative   Bilirubin, UA Negative Negative   Urobilinogen, Ur 0.2 0.2 - 1.0 mg/dL   Nitrite, UA Negative Negative   Microscopic Examination See below:        Pertinent labs & imaging results that were  available during my care of the patient were reviewed by me and considered in my medical decision making.  Assessment & Plan:  Gilberto Streck was seen today for er follow up .  Diagnoses and all orders for this visit:  Night sweats -     Anemia Profile B -     Hormone Panel -     CMP14+EGFR -     Urinalysis, Routine w reflex microscopic -     Acute Viral Hepatitis (HAV, HBV, HCV) -     Microscopic Examination  Myalgia -     Anemia Profile B -     Hormone Panel -     CMP14+EGFR -     Urinalysis, Routine w reflex microscopic -     Acute Viral Hepatitis (HAV, HBV, HCV) -  Microscopic Examination  Malaise and fatigue -     Anemia Profile B -     Hormone Panel -     CMP14+EGFR -     Urinalysis, Routine w reflex microscopic -     Acute Viral Hepatitis (HAV, HBV, HCV) -     Microscopic Examination  Dehydration -     Urinalysis, Routine w reflex microscopic -     Acute Viral Hepatitis (HAV, HBV, HCV) -     Microscopic Examination  Elevated LFTs -     Acute Viral Hepatitis (HAV, HBV, HCV)  Exertional dyspnea -     Albuterol -Budesonide (AIRSUPRA ) 90-80 MCG/ACT AERO; Inhale 2 puffs into the lungs every 4 (four) hours as needed.  Hx of migraine headaches -     SUMAtriptan  (IMITREX ) 100 MG tablet; Take 1 tablet (100 mg total) by mouth once for 1 dose. May repeat in 2 hours if headache persists or recurs.       Presents with body aches, night sweats, nausea, and elevated liver enzymes and platelet count. Differential includes acute viral hepatitis, possibly hepatitis A, due to flu-like symptoms and potential exposure through contaminated food. Dehydration indicated by elevated creatinine and dark urine. Negative COVID and influenza AB tests. Symptoms improved temporarily after IV hydration. Concerns about liver function due to abnormal labs. - Order hepatitis panel to check for acute viral hepatitis. - Recheck liver enzymes, platelet count, and creatinine levels. - Advise to  maintain hydration. - If hepatitis panel is normal and liver enzymes remain elevated, order imaging studies.  Hypertension Experienced significant increase in blood pressure to 220/unknown, reduced to 185/unknown after medication. Concern about current dosage and desires a lower dose. Has not taken medication since incident and is concerned about blood pressure control. - Advise to reduce current antihypertensive medication dose by half. - Monitor blood pressure regularly to assess control.  Type 2 Diabetes Mellitus Reports blood glucose levels ranging from 98 to 324 mg/dL, with a recent spike after consuming cereal. Non-compliant with diabetes medication (Januvia ) and dietary recommendations, consuming high-carb comfort foods during illness. Took Januvia  after high reading and reports subsequent decrease in blood glucose levels. - Encourage adherence to diabetes medication regimen. - Advise on dietary modifications to manage blood glucose levels. - Monitor blood glucose levels regularly.  Menopausal Symptoms Reports symptoms that may be related to menopause, including night sweats and hot flashes. Previous tests for menopause were normal, but symptoms have worsened. Considering hormonal imbalance as a potential cause of nausea and body aches. - Repeat hormone panel to assess menopausal status.  Cataracts Diagnosed with cataracts in both eyes. Scheduled for cataract surgery on July 18th, but plans to postpone due to upcoming vacation. - Reschedule cataract surgery after vacation.          Continue all other maintenance medications.  Follow up plan: Return if symptoms worsen or fail to improve.   Continue healthy lifestyle choices, including diet (rich in fruits, vegetables, and lean proteins, and low in salt and simple carbohydrates) and exercise (at least 30 minutes of moderate physical activity daily).   The above assessment and management plan was discussed with the patient. The  patient verbalized understanding of and has agreed to the management plan. Patient is aware to call the clinic if they develop any new symptoms or if symptoms persist or worsen. Patient is aware when to return to the clinic for a follow-up visit. Patient educated on when it is appropriate to go to the emergency department.  Rosaline Bruns, FNP-C [facility] [phone]

## 2024-02-23 ENCOUNTER — Encounter: Payer: Self-pay | Admitting: Family Medicine

## 2024-02-23 ENCOUNTER — Ambulatory Visit: Payer: Self-pay | Admitting: Family Medicine

## 2024-02-23 LAB — ACUTE VIRAL HEPATITIS (HAV, HBV, HCV)
HCV Ab: NONREACTIVE
Hep A IgM: NEGATIVE
Hep B C IgM: NEGATIVE
Hepatitis B Surface Ag: NEGATIVE

## 2024-02-23 LAB — HCV INTERPRETATION

## 2024-03-02 LAB — CMP14+EGFR
ALT: 29 IU/L (ref 0–32)
AST: 23 IU/L (ref 0–40)
Albumin: 4 g/dL (ref 3.8–4.9)
Alkaline Phosphatase: 147 IU/L — ABNORMAL HIGH (ref 44–121)
BUN/Creatinine Ratio: 26 (ref 12–28)
BUN: 23 mg/dL (ref 8–27)
Bilirubin Total: 0.4 mg/dL (ref 0.0–1.2)
CO2: 23 mmol/L (ref 20–29)
Calcium: 9 mg/dL (ref 8.7–10.3)
Chloride: 97 mmol/L (ref 96–106)
Creatinine, Ser: 0.87 mg/dL (ref 0.57–1.00)
Globulin, Total: 3.5 g/dL (ref 1.5–4.5)
Glucose: 171 mg/dL — ABNORMAL HIGH (ref 70–99)
Potassium: 4.2 mmol/L (ref 3.5–5.2)
Sodium: 136 mmol/L (ref 134–144)
Total Protein: 7.5 g/dL (ref 6.0–8.5)
eGFR: 76 mL/min/1.73 (ref >59)

## 2024-03-02 LAB — ANEMIA PROFILE B
Basophils Absolute: 0.1 x10E3/uL (ref 0.0–0.2)
Basos: 1 %
EOS (ABSOLUTE): 0 x10E3/uL (ref 0.0–0.4)
Eos: 0 %
Ferritin: 1138 ng/mL — ABNORMAL HIGH (ref 15–150)
Folate: 10.5 ng/mL (ref >3.0)
Hematocrit: 46.4 % (ref 34.0–46.6)
Hemoglobin: 15.3 g/dL (ref 11.1–15.9)
Immature Grans (Abs): 0 x10E3/uL (ref 0.0–0.1)
Immature Granulocytes: 0 %
Iron Saturation: 26 % (ref 15–55)
Iron: 84 ug/dL (ref 27–159)
Lymphocytes Absolute: 3.9 x10E3/uL — ABNORMAL HIGH (ref 0.7–3.1)
Lymphs: 54 %
MCH: 28 pg (ref 26.6–33.0)
MCHC: 33 g/dL (ref 31.5–35.7)
MCV: 85 fL (ref 79–97)
Monocytes Absolute: 0.5 x10E3/uL (ref 0.1–0.9)
Monocytes: 8 %
Neutrophils Absolute: 2.7 x10E3/uL (ref 1.4–7.0)
Neutrophils: 37 %
Platelets: 298 x10E3/uL (ref 150–450)
RBC: 5.47 x10E6/uL — ABNORMAL HIGH (ref 3.77–5.28)
RDW: 12.8 % (ref 11.7–15.4)
Retic Ct Pct: 1.4 % (ref 0.6–2.6)
Total Iron Binding Capacity: 328 ug/dL (ref 250–450)
UIBC: 244 ug/dL (ref 131–425)
Vitamin B-12: 1701 pg/mL — ABNORMAL HIGH (ref 232–1245)
WBC: 7.2 x10E3/uL (ref 3.4–10.8)

## 2024-03-02 LAB — HORMONE PANEL (T4,TSH,FSH,TESTT,SHBG,DHEA,ETC)
DHEA-Sulfate, LCMS: <10 ug/dL
Estradiol, Serum, MS: 4.9 pg/mL
Estrone Sulfate: <10 ng/dL
Follicle Stimulating Hormone: 36 m[IU]/mL
Free T-3: 3.6 pg/mL
Free Testosterone, Serum: 0.6 pg/mL — AB
Progesterone, Serum: <10 ng/dL
Sex Hormone Binding Globulin: 153 nmol/L — ABNORMAL HIGH
T4: 12.5 ug/dL
TSH: 5.4 uU/mL — AB
Testosterone, Serum (Total): 12 ng/dL
Testosterone-% Free: 0.5
Triiodothyronine (T-3), Serum: 165 ng/dL

## 2024-03-13 ENCOUNTER — Ambulatory Visit
Admission: EM | Admit: 2024-03-13 | Discharge: 2024-03-13 | Disposition: A | Discharge status: Home / Self Care | Payer: Medicare (Managed Care)

## 2024-03-13 ENCOUNTER — Ambulatory Visit: Payer: Self-pay

## 2024-03-13 DIAGNOSIS — M7989 Other specified soft tissue disorders: Secondary | ICD-10-CM | POA: Diagnosis not present

## 2024-03-13 DIAGNOSIS — T63461A Toxic effect of venom of wasps, accidental (unintentional), initial encounter: Secondary | ICD-10-CM | POA: Diagnosis not present

## 2024-03-13 NOTE — ED Provider Notes (Signed)
 [facility]    CSN: [id number] Arrival date & time: 03/13/24  1559      History   Chief Complaint No chief complaint on file.   HPI Jenny Willis is a 58 y.o. female.   The history is provided by the patient.   Patient presents for complaints of pain and swelling to the right middle finger after she was stung by wasp approximately 7 days ago.  States that she did use her inhaler after she was stung, but denies tongue swelling, throat swelling, or lip swelling.  Patient states she continues to have pain and swelling, but states each day, her symptoms are improving.  Patient denies warmth, redness, numbness, tingling, or radiation of pain.  She states that she does have itching that moves throughout her hand.  Patient states she has been taking ibuprofen for her symptoms.  Denies prior history of anaphylaxis.  Past Medical History:  Diagnosis Date   Diabetes mellitus without complication (HCC)    Hyperlipidemia    Hypertension    Thyroid  disease     Patient Active Problem List   Diagnosis Date Noted   BMI 29.0-29.9,adult 01/20/2024   Diabetic neuropathy associated with type 2 diabetes mellitus (HCC) 01/20/2024   S/P total hysterectomy 01/20/2024   Acquired dilation of ascending aorta and aortic root (HCC) 07/21/2022   Diabetes mellitus (HCC) 01/09/2022   Hypertension associated with diabetes (HCC) 01/09/2022   Hyperlipidemia associated with type 2 diabetes mellitus (HCC) 01/09/2022   Acquired hypothyroidism 01/09/2022   Vitamin D  deficiency 01/09/2022   Ascending aortic aneurysm (HCC) 10/11/2020    Past Surgical History:  Procedure Laterality Date   ABDOMINAL HYSTERECTOMY     BREAST BIOPSY Left    CYSTECTOMY Left     OB History   No obstetric history on file.      Home Medications    Prior to Admission medications   Medication Sig Start Date End Date Taking? Authorizing Provider  Albuterol -Budesonide (AIRSUPRA ) 90-80 MCG/ACT AERO Inhale 2  puffs into the lungs every 4 (four) hours as needed. 02/22/24   Severa Rock HERO, FNP  aspirin 81 MG chewable tablet Chew by mouth. Patient not taking: Reported on 02/22/2024    [provider]  atenolol  (TENORMIN ) 25 MG tablet Take 1 tablet (25 mg total) by mouth at bedtime. 01/20/24   Severa Rock HERO, FNP  butalbital-acetaminophen-caffeine (FIORICET) 50-325-40 MG tablet Take 1 tablet by mouth 2 (two) times daily. 12/08/21   [provider]  cetirizine (ZYRTEC) 10 MG tablet Take by mouth.    [provider]  cyclobenzaprine  (FLEXERIL ) 10 MG tablet TAKE 1 TABLET BY MOUTH THREE TIMES A DAY AS NEEDED FOR MUSCLE SPASM 06/11/23   Severa Rock HERO, FNP  fluticasone  (FLONASE ) 50 MCG/ACT nasal spray Place two sprays in each nostril once daily 10/07/13   Pauline Garnette LABOR, MD  MELATONIN PO Take by mouth.    [provider]  NON FORMULARY OmegaCo3    [provider]  JESSE SCHLOSSMAN Liqua-D (K-87)    [provider]  ondansetron  (ZOFRAN -ODT) 4 MG disintegrating tablet Take 1 tablet (4 mg total) by mouth every 8 (eight) hours as needed for nausea or vomiting. 02/09/24   Gladis Elsie BROCKS, PA-C  OVER THE COUNTER MEDICATION Thyroid  support    [provider]  OVER THE COUNTER MEDICATION Thyroid  support    [provider]  pregabalin (LYRICA) 25 MG capsule Take 25 mg by mouth 2 (two) times daily.  [provider]  Red Yeast Rice 600 MG CAPS Take 2,400 mg by mouth daily.    [provider]  SUMAtriptan  (IMITREX ) 100 MG tablet Take 1 tablet (100 mg total) by mouth once for 1 dose. May repeat in 2 hours if headache persists or recurs. 02/22/24 02/22/24  Severa Rock HERO, FNP  tirzepatide  (MOUNJARO ) 5 MG/0.5ML Pen Inject 5 mg into the skin once a week. 01/20/24   Severa Rock HERO, FNP    Family History Family History  Problem Relation Age of Onset   COPD Mother    Cancer Mother    COPD Father    Cancer Father    Breast cancer Cousin     Breast cancer Cousin    Breast cancer Cousin    Breast cancer Cousin    Cancer Other    Hypertension Other    Diabetes Other    Hyperlipidemia Other     Social History Social History   Tobacco Use   Smoking status: Former   Smokeless tobacco: Never  Substance Use Topics   Alcohol use: No   Drug use: No     Allergies   Gabapentin, Misc. sulfonamide containing compounds, Penicillins, Sulfa antibiotics, Nitrofurantoin macrocrystal, Codeine, Metformin, Sulfonamide derivatives, and Lisinopril   Review of Systems Review of Systems Per HPI  Physical Exam Triage Vital Signs ED Triage Vitals  Encounter Vitals Group     BP 03/13/24 1628 (!) 162/95     Girls Systolic BP Percentile --      Girls Diastolic BP Percentile --      Boys Systolic BP Percentile --      Boys Diastolic BP Percentile --      Pulse Rate 03/13/24 1628 78     Resp 03/13/24 1628 16     Temp 03/13/24 1628 97.8 F (36.6 C)     Temp Source 03/13/24 1628 Oral     SpO2 03/13/24 1628 96 %     Weight --      Height --      Head Circumference --      Peak Flow --      Pain Score 03/13/24 1603 1     Pain Loc --      Pain Education --      Exclude from Growth Chart --    No data found.  Updated Vital Signs BP (!) 162/95 (BP Location: Right Arm)   Pulse 78   Temp 97.8 F (36.6 C) (Oral)   Resp 16   SpO2 96%   Visual Acuity Right Eye Distance:   Left Eye Distance:   Bilateral Distance:    Right Eye Near:   Left Eye Near:    Bilateral Near:     Physical Exam Vitals and nursing note reviewed.  Constitutional:      General: She is not in acute distress.    Appearance: Normal appearance.  HENT:     Head: Normocephalic.  Eyes:     Extraocular Movements: Extraocular movements intact.     Pupils: Pupils are equal, round, and reactive to light.  Cardiovascular:     Rate and Rhythm: Normal rate and regular rhythm.  Pulmonary:     Effort: Pulmonary effort is normal. No respiratory distress.      Breath sounds: Normal breath sounds. No stridor. No wheezing, rhonchi or rales.  Musculoskeletal:     Comments: Swelling to the finger pad of the right middle finger.  There is no obvious redness, deformity, or drainage  present.  The area is tender to palpation.  Skin:    General: Skin is warm and dry.  Neurological:     General: No focal deficit present.     Mental Status: She is alert and oriented to person, place, and time.  Psychiatric:        Mood and Affect: Mood normal.        Behavior: Behavior normal.      UC Treatments / Results  Labs (all labs ordered are listed, but only abnormal results are displayed) Labs Reviewed - No data to display  EKG   Radiology No results found.  Procedures Procedures (including critical care time)  Medications Ordered in UC Medications - No data to display  Initial Impression / Assessment and Plan / UC Course  I have reviewed the triage vital signs and the nursing notes.  Pertinent labs & imaging results that were available during my care of the patient were reviewed by me and considered in my medical decision making (see chart for details).  Patient with swelling of the right middle finger status post wasp sting 7 days ago.  The right middle finger is without erythema, warmth, or drainage.  No signs of infection present.  She does have tenderness that has continued, which is consistent with the sting.  Patient declined Decadron  IM to help with swelling and inflammation.  Supportive care recommendations were discussed with the patient to include continuing over-the-counter analgesics, ice, and to monitor for signs of worsening.  Patient was given indications regarding follow-up.  Patient was in agreement with this plan of care and verbalizes understanding.  All questions were answered.  Patient stable for discharge.  Final Clinical Impressions(s) / UC Diagnoses   Final diagnoses:  None   Discharge Instructions   None    ED  Prescriptions   None    PDMP not reviewed this encounter.   Gilmer Etta PARAS, NP 03/13/24 1708

## 2024-03-13 NOTE — Telephone Encounter (Signed)
 FYI Only or Action Required?: FYI only for provider.  Patient was last seen in primary care on 02/22/2024 by Severa Rock HERO, FNP.  Called Nurse Triage reporting Insect Bite.  Symptoms began a week ago.  Interventions attempted: Nothing.  Symptoms are: gradually worsening.  Triage Disposition: See Physician Within 24 Hours  Patient/caregiver understands and will follow disposition?: Yes Reason for Disposition  [1] Red or very tender (to touch) area AND [2] started over 24 hours after the sting  Answer Assessment - Initial Assessment Questions No in office appointments, patient agreed to go to [facility] [place] today  1. TYPE: What type of sting was it? (e.g., bee, yellow jacket, unknown)      Bee  2. ONSET: When did it occur?      03/06/24  3. LOCATION: Where is the sting located?  How many stings?     Middle right finger - confirmed NOT wearing a ring  4. SWELLING SIZE: How big is the swelling? (e.g., inches or cm)     Very very swollen tip  5. REDNESS: Is the area red or pink? If Yes, ask: What size is area of redness? (e.g., inches or cm). When did the redness start?     Yes  6. PAIN: Is there any pain? If Yes, ask: How bad is it?  (Scale 0-10; or none, mild, moderate, severe)     Moderate  7. ITCHING: Is there any itching? If Yes, ask: How bad is it?      Yes  8. RESPIRATORY DISTRESS: Describe your breathing.     Denies  9. PRIOR REACTIONS: Have you had any severe allergic reactions to stings in the past? If Yes, ask: What happened?     Denies  Protocols used: Bee or Yellow Jacket Sting-A-AH Copied from CRM [id number]. Topic: Clinical - Red Word Triage >> Mar 13, 2024  3:01 PM [place] B wrote: [facility] that prompted transfer to Nurse Triage: bee sting on right hand , middle finger is swollen

## 2024-03-13 NOTE — Telephone Encounter (Signed)
Noted  -LS

## 2024-03-13 NOTE — Discharge Instructions (Signed)
Patient declined AVS 

## 2024-03-13 NOTE — ED Triage Notes (Signed)
 Pt reports she has a wasp sting to her right middle finger that is slightly painful x 7 days    Took ibuprofen

## 2024-03-27 ENCOUNTER — Ambulatory Visit
Admission: RE | Admit: 2024-03-27 | Discharge: 2024-03-27 | Disposition: A | Discharge status: Home / Self Care | Payer: Medicare (Managed Care) | Source: Ambulatory Visit | Attending: Family Medicine | Admitting: Family Medicine

## 2024-03-27 ENCOUNTER — Inpatient Hospital Stay: Admission: RE | Admit: 2024-03-27 | Payer: Medicare (Managed Care) | Source: Ambulatory Visit

## 2024-03-27 DIAGNOSIS — Z1231 Encounter for screening mammogram for malignant neoplasm of breast: Secondary | ICD-10-CM | POA: Diagnosis not present

## 2024-03-29 ENCOUNTER — Ambulatory Visit: Payer: Self-pay | Admitting: Family Medicine
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
## 2024-04-26 ENCOUNTER — Ambulatory Visit: Admitting: Cardiology

## 2024-04-26 DIAGNOSIS — E118 Type 2 diabetes mellitus with unspecified complications: Secondary | ICD-10-CM

## 2024-04-26 DIAGNOSIS — I77810 Thoracic aortic ectasia: Secondary | ICD-10-CM

## 2024-04-26 DIAGNOSIS — I10 Essential (primary) hypertension: Secondary | ICD-10-CM

## 2024-04-27 ENCOUNTER — Ambulatory Visit: Payer: Medicare (Managed Care) | Admitting: Family Medicine

## 2024-05-22 ENCOUNTER — Other Ambulatory Visit: Payer: Self-pay | Admitting: *Deleted

## 2024-05-22 DIAGNOSIS — E1169 Type 2 diabetes mellitus with other specified complication: Secondary | ICD-10-CM

## 2024-05-22 DIAGNOSIS — I152 Hypertension secondary to endocrine disorders: Secondary | ICD-10-CM

## 2024-05-22 MED ORDER — TIRZEPATIDE 5 MG/0.5ML ~~LOC~~ SOAJ: 5.0000 mg | SUBCUTANEOUS | 0 refills | Status: DC

## 2024-05-24 DIAGNOSIS — Z87891 Personal history of nicotine dependence: Secondary | ICD-10-CM | POA: Diagnosis not present

## 2024-05-24 DIAGNOSIS — E1165 Type 2 diabetes mellitus with hyperglycemia: Secondary | ICD-10-CM | POA: Diagnosis not present

## 2024-05-24 DIAGNOSIS — R079 Chest pain, unspecified: Secondary | ICD-10-CM | POA: Diagnosis not present

## 2024-05-24 DIAGNOSIS — Z7985 Long-term (current) use of injectable non-insulin antidiabetic drugs: Secondary | ICD-10-CM | POA: Diagnosis not present

## 2024-05-24 DIAGNOSIS — Z79890 Hormone replacement therapy (postmenopausal): Secondary | ICD-10-CM | POA: Diagnosis not present

## 2024-05-24 DIAGNOSIS — Z7951 Long term (current) use of inhaled steroids: Secondary | ICD-10-CM | POA: Diagnosis not present

## 2024-05-24 DIAGNOSIS — E876 Hypokalemia: Secondary | ICD-10-CM | POA: Diagnosis not present

## 2024-05-24 DIAGNOSIS — Z7984 Long term (current) use of oral hypoglycemic drugs: Secondary | ICD-10-CM | POA: Diagnosis not present

## 2024-05-24 DIAGNOSIS — I351 Nonrheumatic aortic (valve) insufficiency: Secondary | ICD-10-CM | POA: Diagnosis not present

## 2024-05-24 DIAGNOSIS — I10 Essential (primary) hypertension: Secondary | ICD-10-CM | POA: Diagnosis not present

## 2024-05-24 DIAGNOSIS — I161 Hypertensive emergency: Secondary | ICD-10-CM | POA: Diagnosis not present

## 2024-05-24 DIAGNOSIS — I21A1 Myocardial infarction type 2: Secondary | ICD-10-CM | POA: Diagnosis not present

## 2024-05-24 DIAGNOSIS — I214 Non-ST elevation (NSTEMI) myocardial infarction: Secondary | ICD-10-CM | POA: Diagnosis not present

## 2024-05-24 DIAGNOSIS — I498 Other specified cardiac arrhythmias: Secondary | ICD-10-CM | POA: Diagnosis not present

## 2024-05-24 DIAGNOSIS — Z7982 Long term (current) use of aspirin: Secondary | ICD-10-CM | POA: Diagnosis not present

## 2024-05-24 DIAGNOSIS — R7989 Other specified abnormal findings of blood chemistry: Secondary | ICD-10-CM | POA: Diagnosis not present

## 2024-05-24 DIAGNOSIS — Z882 Allergy status to sulfonamides status: Secondary | ICD-10-CM | POA: Diagnosis not present

## 2024-05-24 DIAGNOSIS — Z880 Allergy status to penicillin: Secondary | ICD-10-CM | POA: Diagnosis not present

## 2024-05-24 DIAGNOSIS — Z79899 Other long term (current) drug therapy: Secondary | ICD-10-CM | POA: Diagnosis not present

## 2024-05-24 DIAGNOSIS — Z9079 Acquired absence of other genital organ(s): Secondary | ICD-10-CM | POA: Diagnosis not present

## 2024-05-24 DIAGNOSIS — Z888 Allergy status to other drugs, medicaments and biological substances status: Secondary | ICD-10-CM | POA: Diagnosis not present

## 2024-05-24 DIAGNOSIS — Z881 Allergy status to other antibiotic agents status: Secondary | ICD-10-CM | POA: Diagnosis not present

## 2024-05-24 DIAGNOSIS — E119 Type 2 diabetes mellitus without complications: Secondary | ICD-10-CM | POA: Diagnosis not present

## 2024-05-24 DIAGNOSIS — R739 Hyperglycemia, unspecified: Secondary | ICD-10-CM | POA: Diagnosis not present

## 2024-05-24 DIAGNOSIS — I249 Acute ischemic heart disease, unspecified: Secondary | ICD-10-CM | POA: Diagnosis not present

## 2024-05-25 ENCOUNTER — Ambulatory Visit: Payer: Medicare (Managed Care) | Admitting: Family Medicine

## 2024-05-26 ENCOUNTER — Encounter: Payer: Self-pay | Admitting: Family Medicine

## 2024-05-29 ENCOUNTER — Telehealth: Payer: Self-pay

## 2024-05-29 NOTE — Transitions of Care (Post Inpatient/ED Visit) (Signed)
   05/29/2024  Name: Jenny Willis MRN: [id number] DOB: 1966-04-18  Today's TOC FU Call Status: Today's TOC FU Call Status:: Successful TOC FU Call Completed TOC FU Call Complete Date: 05/29/24 Patient's Name and Date of Birth confirmed.  Transition Care Management Follow-up Telephone Call Date of Discharge: 05/26/24 Discharge Facility: Other ([facility]) Name of Other ([facility]) [facility]charge Facility: [facility] Type of Discharge: Inpatient Admission Primary Inpatient Discharge Diagnosis:: Hypertensive emergency How have you been since you were released from the hospital?: Better Any questions or concerns?: No  Items Reviewed: Did you receive and understand the discharge instructions provided?: Yes   @ 1128:  Placed call to patient who answered and reports she is feeling well. Reports she [place] at the mall with the kids and can not talk. Encouraged patient to call me back.  Alan Ee, RN, BSN, CEN [employer]- Transition of Care Team.  [facility] [phone]

## 2024-05-31 ENCOUNTER — Telehealth: Payer: Self-pay | Admitting: *Deleted

## 2024-05-31 ENCOUNTER — Telehealth: Payer: Self-pay

## 2024-05-31 NOTE — Transitions of Care (Post Inpatient/ED Visit) (Signed)
 05/31/2024  Name: Jenny Willis MRN: [id number] DOB: [birth year]  Today's TOC FU Call Status: Today's TOC FU Call Status:: Successful TOC FU Call Completed TOC FU Call Complete Date: 05/31/24 Patient's Name and Date of Birth confirmed.  Transition Care Management Follow-up Telephone Call Date of Discharge: 05/26/24 Discharge Facility: Other ([facility]) [facility] of [facility]charge Facility: [facility] Type of Discharge: Inpatient Admission Primary Inpatient Discharge Diagnosis:: Hypertensive emergency How have you been since you were released from the hospital?: Better (eating, drinking well) Any questions or concerns?: No  Items Reviewed: Did you receive and understand the discharge instructions provided?: Yes Medications obtained,verified, and reconciled?: Yes (Medications Reviewed) Any new allergies since your discharge?: No Dietary orders reviewed?: Yes Type of Diet Ordered:: heart healthy Do you have support at home?: Yes People in Home [RPT]: spouse Name of Support/Comfort Primary Source: Dale Edouard Reviewed normal parameters for blood pressure, pt is monitoring at home, states she is not taking atenolol  because she was instructed not to take unless systolic is >200, states  makes my blood pressure drop too low and not taking it until I see my doctor Reviewed signs /symptoms MI, reportable signs/ symptoms Instructed pt to take medications as prescribed Pt reports she takes januvia  but unsure of dosage, does not have medication in front of her  Medications Reviewed Today: Medications Reviewed Today     Reviewed by Aura Mliss LABOR, RN (Registered Nurse) on 05/31/24 at 1636  Med List Status: <None>   Medication Order Taking? Sig Documenting Provider Last Dose Status Informant  Albuterol -Budesonide (AIRSUPRA ) 90-80 MCG/ACT AERO [id number] Yes Inhale 2 puffs into the lungs every 4 (four) hours as needed. Severa Rock HERO, FNP  Active   aspirin 81 MG  chewable tablet [id number] Yes Chew by mouth. [provider]  Active   atenolol  (TENORMIN ) 25 MG tablet [id number]  Take 1 tablet (25 mg total) by mouth at bedtime.  Patient not taking: Reported on 05/31/2024   Severa Rock HERO, FNP  Active   butalbital-acetaminophen-caffeine (FIORICET) 50-325-40 MG tablet [id number]  Take 1 tablet by mouth 2 (two) times daily.  Patient not taking: Reported on 05/31/2024   [provider]  Active   cetirizine (ZYRTEC) 10 MG tablet [id number] Yes Take by mouth. [provider]  Active   cyclobenzaprine  (FLEXERIL ) 10 MG tablet [id number] Yes TAKE 1 TABLET BY MOUTH THREE TIMES A DAY AS NEEDED FOR MUSCLE SPASM Rakes, Rock HERO, FNP  Active   fluticasone  (FLONASE ) 50 MCG/ACT nasal spray [id number] Yes Place two sprays in each nostril once daily Pauline Garnette LABOR, MD  Active   MELATONIN PO [id number] Yes Take by mouth. [provider]  Active   JESSE SCHLOSSMAN [id number]  OmegaCo3 [provider]  Active   JESSE SCHLOSSMAN [id number]  Liqua-D (K-87) [provider]  Active   ondansetron  (ZOFRAN -ODT) 4 MG disintegrating tablet [id number] Yes Take 1 tablet (4 mg total) by mouth every 8 (eight) hours as needed for nausea or vomiting. Gladis Elsie JAYSON DEVONNA  Active   OVER THE COUNTER MEDICATION [id number]  Thyroid  support [provider]  Active   OVER THE COUNTER MEDICATION [id number]  Thyroid  support [provider]  Active   pregabalin (LYRICA) 25 MG capsule [id number] Yes Take 25 mg by mouth 2 (two) times daily. [provider]  Active   Red Yeast Rice 600 MG CAPS [id number] Yes Take 2,400 mg by mouth daily. [provider]  Active   SUMAtriptan  (IMITREX ) 100  MG tablet [id number]  Take 1 tablet (100 mg total) by mouth once for 1 dose. May repeat in 2 hours if headache persists or recurs.  Patient not taking: Reported on 05/31/2024   Severa Rock HERO, FNP  Expired 02/22/24 2359   tirzepatide  (MOUNJARO ) 5  MG/0.5ML Pen [id number] Yes Inject 5 mg into the skin once a week. Severa Rock HERO, FNP  Active             Home Care and Equipment/Supplies: Were Home Health Services Ordered?: No Any new equipment or medical supplies ordered?: No  Functional Questionnaire: Do you need assistance with bathing/showering or dressing?: No Do you need assistance with meal preparation?: No Do you need assistance with eating?: No Do you have difficulty maintaining continence: No Do you need assistance with getting out of bed/getting out of a chair/moving?: No Do you have difficulty managing or taking your medications?: No  Follow up appointments reviewed: PCP Follow-up appointment confirmed?: Yes Date of PCP follow-up appointment?: 06/20/24 Follow-up Provider: Rock Severa FNP Do you need transportation to your follow-up appointment?: No Do you understand care options if your condition(s) worsen?: Yes-patient verbalized understanding  SDOH Interventions Today    Flowsheet Row Most Recent Value  SDOH Interventions   Food Insecurity Interventions Intervention Not Indicated  Housing Interventions Intervention Not Indicated  Transportation Interventions Intervention Not Indicated  Utilities Interventions Intervention Not Indicated    Mliss Creed [facility], BSN RN Care Manager/ Transition of Care [place]/ [facility] Population [facility] [phone]

## 2024-05-31 NOTE — Transitions of Care (Post Inpatient/ED Visit) (Signed)
   05/31/2024  Name: Jenny Willis MRN: [id number] DOB: 1965-12-25  Today's TOC FU Call Status: Today's TOC FU Call Status:: Unsuccessful Call (2nd Attempt) Unsuccessful Call (2nd Attempt) Date: 05/31/24  Attempted to reach the patient regarding the most recent Inpatient/ED visit.  Follow Up Plan: Additional outreach attempts will be made to reach the patient to complete the Transitions of Care (Post Inpatient/ED visit) call.   Alan Ee, RN, BSN, CEN [employer]- Transition of Care Team.  [facility] [phone]

## 2024-06-08 ENCOUNTER — Inpatient Hospital Stay: Payer: Medicare (Managed Care) | Admitting: Nurse Practitioner

## 2024-06-08 ENCOUNTER — Inpatient Hospital Stay: Payer: Medicare (Managed Care) | Admitting: Family Medicine

## 2024-06-20 ENCOUNTER — Encounter: Payer: Self-pay | Admitting: Family Medicine

## 2024-06-20 ENCOUNTER — Ambulatory Visit (INDEPENDENT_AMBULATORY_CARE_PROVIDER_SITE_OTHER): Payer: Medicare (Managed Care) | Admitting: Family Medicine

## 2024-06-20 ENCOUNTER — Ambulatory Visit: Payer: Medicare (Managed Care) | Admitting: Family Medicine

## 2024-06-20 VITALS — BP 176/120 | HR 83 | Temp 97.2°F | Ht 67.0 in | Wt 202.0 lb

## 2024-06-20 DIAGNOSIS — Z7985 Long-term (current) use of injectable non-insulin antidiabetic drugs: Secondary | ICD-10-CM | POA: Diagnosis not present

## 2024-06-20 DIAGNOSIS — E1141 Type 2 diabetes mellitus with diabetic mononeuropathy: Secondary | ICD-10-CM | POA: Diagnosis not present

## 2024-06-20 DIAGNOSIS — E1159 Type 2 diabetes mellitus with other circulatory complications: Secondary | ICD-10-CM

## 2024-06-20 DIAGNOSIS — E039 Hypothyroidism, unspecified: Secondary | ICD-10-CM

## 2024-06-20 DIAGNOSIS — I214 Non-ST elevation (NSTEMI) myocardial infarction: Secondary | ICD-10-CM

## 2024-06-20 DIAGNOSIS — E1169 Type 2 diabetes mellitus with other specified complication: Secondary | ICD-10-CM

## 2024-06-20 DIAGNOSIS — E785 Hyperlipidemia, unspecified: Secondary | ICD-10-CM | POA: Diagnosis not present

## 2024-06-20 DIAGNOSIS — I7121 Aneurysm of the ascending aorta, without rupture: Secondary | ICD-10-CM

## 2024-06-20 DIAGNOSIS — Z7984 Long term (current) use of oral hypoglycemic drugs: Secondary | ICD-10-CM

## 2024-06-20 DIAGNOSIS — I152 Hypertension secondary to endocrine disorders: Secondary | ICD-10-CM | POA: Diagnosis not present

## 2024-06-20 DIAGNOSIS — E559 Vitamin D deficiency, unspecified: Secondary | ICD-10-CM | POA: Diagnosis not present

## 2024-06-20 MED ORDER — TIRZEPATIDE 7.5 MG/0.5ML ~~LOC~~ SOAJ: 7.5000 mg | SUBCUTANEOUS | 3 refills | Status: DC

## 2024-06-20 MED ORDER — DAPAGLIFLOZIN PROPANEDIOL 5 MG PO TABS
5.0000 mg | ORAL_TABLET | Freq: Every day | ORAL | 3 refills | Status: AC
Start: 2024-06-20 — End: ?

## 2024-06-20 MED ORDER — METOPROLOL TARTRATE 25 MG PO TABS: 25.0000 mg | ORAL_TABLET | Freq: Two times a day (BID) | ORAL | 3 refills | Status: DC

## 2024-06-20 NOTE — Patient Instructions (Signed)
 Goal BP:  Less than 130/80  Take your medications faithfully as prescribed. Maintain a healthy weight. Get at least 150 minutes of aerobic exercise per week. Minimize salt intake, less than 2000 mg per day. Minimize alcohol intake.  DASH Eating Plan DASH stands for Dietary Approaches to Stop Hypertension. The DASH eating plan is a healthy eating plan that has been shown to reduce high blood pressure (hypertension). Additional health benefits may include reducing the risk of type 2 diabetes mellitus, heart disease, and stroke. The DASH eating plan may also help with weight loss.  WHAT DO I NEED TO KNOW ABOUT THE DASH EATING PLAN? For the DASH eating plan, you will follow these general guidelines: Choose foods with a percent daily value for sodium of less than 5% (as listed on the food label). Use salt-free seasonings or herbs instead of table salt or sea salt. Check with your health care provider or pharmacist before using salt substitutes. Eat lower-sodium products, often labeled as lower sodium or no salt added. Eat fresh foods. Eat more vegetables, fruits, and low-fat dairy products. Choose whole grains. Look for the word whole as the first word in the ingredient list. Choose fish and skinless chicken or [place] more often than red meat. Limit fish, poultry, and meat to 6 oz (170 g) each day. Limit sweets, desserts, sugars, and sugary drinks. Choose heart-healthy fats. Limit cheese to 1 oz (28 g) per day. Eat more home-cooked food and less restaurant, buffet, and fast food. Limit fried foods. Cook foods using methods other than frying. Limit canned vegetables. If you do use them, rinse them well to decrease the sodium. When eating at a restaurant, ask that your food be prepared with less salt, or no salt if possible.  WHAT FOODS CAN I EAT? Seek help from a dietitian for individual calorie needs.  Grains Whole grain or whole wheat bread. Brown rice. Whole grain or whole  wheat pasta. Quinoa, bulgur, and whole grain cereals. Low-sodium cereals. Corn or whole wheat flour tortillas. Whole grain cornbread. Whole grain crackers. Low-sodium crackers.  Vegetables Fresh or frozen vegetables (raw, steamed, roasted, or grilled). Low-sodium or reduced-sodium tomato and vegetable juices. Low-sodium or reduced-sodium tomato sauce and paste. Low-sodium or reduced-sodium canned vegetables.   Fruits All fresh, canned (in natural juice), or frozen fruits.  Meat and Other Protein Products Ground beef (85% or leaner), grass-fed beef, or beef trimmed of fat. Skinless chicken or [place]. Ground chicken or [place]. Pork trimmed of fat. All fish and seafood. Eggs. Dried beans, peas, or lentils. Unsalted nuts and seeds. Unsalted canned beans.  Dairy Low-fat dairy products, such as skim or 1% milk, 2% or reduced-fat cheeses, low-fat ricotta or cottage cheese, or plain low-fat yogurt. Low-sodium or reduced-sodium cheeses.  Fats and Oils Tub margarines without trans fats. Light or reduced-fat mayonnaise and salad dressings (reduced sodium). Avocado. Safflower, olive, or canola oils. Natural peanut or almond butter.  Other Unsalted popcorn and pretzels. The items listed above may not be a complete list of recommended foods or beverages. Contact your dietitian for more options.  WHAT FOODS ARE NOT RECOMMENDED?  Grains White bread. White pasta. White rice. Refined cornbread. Bagels and croissants. Crackers that contain trans fat.  Vegetables Creamed or fried vegetables. Vegetables in a cheese sauce. Regular canned vegetables. Regular canned tomato sauce and paste. Regular tomato and vegetable juices.  Fruits Dried fruits. Canned fruit in light or heavy syrup. Fruit juice.  Meat and Other Protein Products Fatty cuts of meat. Ribs,  chicken wings, bacon, sausage, bologna, salami, chitterlings, fatback, hot dogs, bratwurst, and packaged luncheon meats. Salted nuts and seeds. Canned  beans with salt.  Dairy Whole or 2% milk, cream, half-and-half, and cream cheese. Whole-fat or sweetened yogurt. Full-fat cheeses or blue cheese. Nondairy creamers and whipped toppings. Processed cheese, cheese spreads, or cheese curds.  Condiments Onion and garlic salt, seasoned salt, table salt, and sea salt. Canned and packaged gravies. Worcestershire sauce. Tartar sauce. Barbecue sauce. Teriyaki sauce. Soy sauce, including reduced sodium. Steak sauce. Fish sauce. Oyster sauce. Cocktail sauce. Horseradish. Ketchup and mustard. Meat flavorings and tenderizers. Bouillon cubes. Hot sauce. Tabasco sauce. Marinades. Taco seasonings. Relishes.  Fats and Oils Butter, stick margarine, lard, shortening, ghee, and bacon fat. Coconut, palm kernel, or palm oils. Regular salad dressings.  Other Pickles and olives. Salted popcorn and pretzels.  The items listed above may not be a complete list of foods and beverages to avoid. Contact your dietitian for more information.  WHERE CAN I FIND MORE INFORMATION? National Heart, Lung, and Blood Institute: [employer]it Document Released: 07/23/2011 Document Revised: 12/18/2013 Document Reviewed: 06/07/2013 [facility] Patient Information 2015 [place], [place]. This information is not intended to replace advice given to you by your health care provider. Make sure you discuss any questions you have with your health care provider.   I think that you would greatly benefit from seeing a nutritionist.  If you are interested, please call Dr. Wonda at [phone] to schedule an appointment. Continue to monitor your blood sugars as we discussed and record them. Bring the log to your next appointment.  Take your medications as directed.    Goal Blood glucose:    Fasting (before meals) = 80 to 130   Within 2 hours of eating = less than 180   Understanding your Hemoglobin A1c:     Diabetes Mellitus and Nutrition    I think  that you would greatly benefit from seeing a nutritionist. If this is something you are interested in, please call Dr Wonda at [phone] to schedule an appointment.   When you have diabetes (diabetes mellitus), it is very important to have healthy eating habits because your blood sugar (glucose) levels are greatly affected by what you eat and drink. Eating healthy foods in the appropriate amounts, at about the same times every day, can help you: Control your blood glucose. Lower your risk of heart disease. Improve your blood pressure. Reach or maintain a healthy weight.  Every person with diabetes is different, and each person has different needs for a meal plan. Your health care provider may recommend that you work with a diet and nutrition specialist (dietitian) to make a meal plan that is best for you. Your meal plan may vary depending on factors such as: The calories you need. The medicines you take. Your weight. Your blood glucose, blood pressure, and cholesterol levels. Your activity level. Other health conditions you have, such as heart or kidney disease.  How do carbohydrates affect me? Carbohydrates affect your blood glucose level more than any other type of food. Eating carbohydrates naturally increases the amount of glucose in your blood. Carbohydrate counting is a method for keeping track of how many carbohydrates you eat. Counting carbohydrates is important to keep your blood glucose at a healthy level, especially if you use insulin or take certain oral diabetes medicines. It is important to know how many carbohydrates you can safely have in each meal. This is different for every person. Your dietitian can help you calculate  how many carbohydrates you should have at each meal and for snack. Foods that contain carbohydrates include: Bread, cereal, rice, pasta, and crackers. Potatoes and corn. Peas, beans, and lentils. Milk and yogurt. Fruit and juice. Desserts, such as cakes,  cookies, ice cream, and candy.  How does alcohol affect me? Alcohol can cause a sudden decrease in blood glucose (hypoglycemia), especially if you use insulin or take certain oral diabetes medicines. Hypoglycemia can be a life-threatening condition. Symptoms of hypoglycemia (sleepiness, dizziness, and confusion) are similar to symptoms of having too much alcohol. If your health care provider says that alcohol is safe for you, follow these guidelines: Limit alcohol intake to no more than 1 drink per day for nonpregnant women and 2 drinks per day for men. One drink equals 12 oz of beer, 5 oz of wine, or 1 oz of hard liquor. Do not drink on an empty stomach. Keep yourself hydrated with water, diet soda, or unsweetened iced tea. Keep in mind that regular soda, juice, and other mixers may contain a lot of sugar and must be counted as carbohydrates.  What are tips for following this plan?  Reading food labels Start by checking the serving size on the label. The amount of calories, carbohydrates, fats, and other nutrients listed on the label are based on one serving of the food. Many foods contain more than one serving per package. Check the total grams (g) of carbohydrates in one serving. You can calculate the number of servings of carbohydrates in one serving by dividing the total carbohydrates by 15. For example, if a food has 30 g of total carbohydrates, it would be equal to 2 servings of carbohydrates. Check the number of grams (g) of saturated and trans fats in one serving. Choose foods that have low or no amount of these fats. Check the number of milligrams (mg) of sodium in one serving. Most people should limit total sodium intake to less than 2,300 mg per day. Always check the nutrition information of foods labeled as low-fat or nonfat. These foods may be higher in added sugar or refined carbohydrates and should be avoided. Talk to your dietitian to identify your daily goals for nutrients  listed on the label.  Shopping Avoid buying canned, premade, or processed foods. These foods tend to be high in fat, sodium, and added sugar. Shop around the outside edge of the grocery store. This includes fresh fruits and vegetables, bulk grains, fresh meats, and fresh dairy.  Cooking Use low-heat cooking methods, such as baking, instead of high-heat cooking methods like deep frying. Cook using healthy oils, such as olive, canola, or sunflower oil. Avoid cooking with butter, cream, or high-fat meats.  Meal planning Eat meals and snacks regularly, preferably at the same times every day. Avoid going long periods of time without eating. Eat foods high in fiber, such as fresh fruits, vegetables, beans, and whole grains. Talk to your dietitian about how many servings of carbohydrates you can eat at each meal. Eat 4-6 ounces of lean protein each day, such as lean meat, chicken, fish, eggs, or tofu. 1 ounce is equal to 1 ounce of meat, chicken, or fish, 1 egg, or 1/4 cup of tofu. Eat some foods each day that contain healthy fats, such as avocado, nuts, seeds, and fish.  Lifestyle  Check your blood glucose regularly. Exercise at least 30 minutes 5 or more days each week, or as told by your health care provider. Take medicines as told by your health care  provider. Do not use any products that contain nicotine or tobacco, such as cigarettes and e-cigarettes. If you need help quitting, ask your health care provider. Work with a Veterinary surgeon or diabetes educator to identify strategies to manage stress and any emotional and social challenges.  What are some questions to ask my health care provider? Do I need to meet with a diabetes educator? Do I need to meet with a dietitian? What number can I call if I have questions? When are the best times to check my blood glucose?  Where to find more information: American Diabetes Association: diabetes.org/food-and-fitness/food Academy of Nutrition and  Dietetics: https://www.vargas.com/ [employer] of Diabetes and Digestive and Kidney Diseases (NIH): FindJewelers.cz  Summary A healthy meal plan will help you control your blood glucose and maintain a healthy lifestyle. Working with a diet and nutrition specialist (dietitian) can help you make a meal plan that is best for you. Keep in mind that carbohydrates and alcohol have immediate effects on your blood glucose levels. It is important to count carbohydrates and to use alcohol carefully. This information is not intended to replace advice given to you by your health care provider. Make sure you discuss any questions you have with your health care provider. Document Released: 04/30/2005 Document Revised: 09/07/2016 Document Reviewed: 09/07/2016 [employer] Interactive Patient Education  [employer].

## 2024-06-20 NOTE — Progress Notes (Signed)
 Subjective:  Patient ID: Jenny Willis, female    DOB: July 21, 1966, 58 y.o.   MRN: [id number]  Patient Care Team: Severa Rock HERO, FNP as PCP - General (Family Medicine) [name], MD as PCP - Cardiology (Cardiology)   Chief Complaint:  Hospitalization Follow-up (05/24/2024/[facility]- Hypertensive emergency ) and Medical Management of Chronic Issues   HPI: Jenny Willis is a 58 y.o. female presenting on 06/20/2024 for Hospitalization Follow-up (05/24/2024/[facility]- Hypertensive emergency ) and Medical Management of Chronic Issues  Jenny Willis is a 58 year old female with hypertension and diabetes who presents for follow-up after a recent hospitalization for hypertensive urgency and non-ST elevation myocardial infarction (non-STEMI).  Hypertensive urgency and blood pressure management - Recent hospitalization for hypertensive urgency and non-ST elevation myocardial infarction (non-STEMI) with elevated troponin levels - Jaw pain was the initial symptom prompting emergency care - Since discharge, blood pressure has not exceeded 185 mmHg - Unusual increase in blood pressure after consuming a small amount of soda - Has not started hydrochlorothiazide or nifedipine, awaiting consultation with primary care provider - History of hypertension, previously treated with atenolol  and metoprolol ; open to resuming metoprolol  - Physically active, walking miles daily without shortness of breath or other symptoms  Diabetes mellitus management - Hospitalization revealed hemoglobin A1c of 8.8%, improved from previous level of 11% - Currently taking Januvia  - Current 5 mg dose of Mounjaro  is insufficient for appetite control; previously reduced from 12 mg due to side effects - Considering increasing Mounjaro  dose for better diabetic control.   Visual disturbances and cataracts - History of cataracts with plans for surgery once  blood pressure is better controlled - Experiencing some blurry vision attributed to cataracts  Medication and supplement use - Takes various supplements including beets, ashwagandha, and turmeric - Mindful of interactions with thyroid  medication  Respiratory symptoms - No significant exertional symptoms during activities - Used inhaler only once in the past four months          Relevant past medical, surgical, family, and social history reviewed and updated as indicated.  Allergies and medications reviewed and updated. Data reviewed: Chart in Epic.   Past Medical History:  Diagnosis Date   Diabetes mellitus without complication (HCC)    Hyperlipidemia    Hypertension    Thyroid  disease     Past Surgical History:  Procedure Laterality Date   ABDOMINAL HYSTERECTOMY     BREAST BIOPSY Left    CYSTECTOMY Left     Social History   Socioeconomic History   Marital status: Married    Spouse name: Not on file   Number of children: Not on file   Years of education: Not on file   Highest education level: Not on file  Occupational History   Not on file  Tobacco Use   Smoking status: Former   Smokeless tobacco: Never  Substance and Sexual Activity   Alcohol use: No   Drug use: No   Sexual activity: Yes  Other Topics Concern   Not on file  Social History Narrative   Not on file   Social Drivers of Health   Financial Resource Strain: Low Risk  (10/01/2021)   Received from [facility]   Overall Financial Resource Strain (CARDIA)    Difficulty of Paying Living Expenses: Not hard at all  Food Insecurity: No Food Insecurity (05/31/2024)   Hunger Vital Sign    Worried About Running Out of  Food in the Last Year: Never true    Ran Out of Food in the Last Year: Never true  Transportation Needs: No Transportation Needs (05/31/2024)   PRAPARE - Administrator, Civil Service (Medical): No    Lack of Transportation (Non-Medical): No  Physical Activity:  Insufficiently Active (10/01/2021)   Received from [facility]   Exercise Vital Sign    On average, how many days per week do you engage in moderate to strenuous exercise (like a brisk walk)?: 3 days    On average, how many minutes do you engage in exercise at this level?: 10 min  Stress: No Stress Concern Present (05/24/2024)   Received from [facility] - Occupational Stress Questionnaire    Do you feel stress - tense, restless, nervous, or anxious, or unable to sleep at night because your mind is troubled all the time - these days?: Not at all  Social Connections: Unknown (12/15/2021)   Received from [facility]   Social Network    Social Network: Not on file  Intimate Partner Violence: Not At Risk (05/31/2024)   Humiliation, Afraid, Rape, and Kick questionnaire    Fear of Current or Ex-Partner: No    Emotionally Abused: No    Physically Abused: No    Sexually Abused: No    Outpatient Encounter Medications as of 06/20/2024  Medication Sig   Albuterol -Budesonide (AIRSUPRA ) 90-80 MCG/ACT AERO Inhale 2 puffs into the lungs every 4 (four) hours as needed.   aspirin 81 MG chewable tablet Chew by mouth.   butalbital-acetaminophen-caffeine (FIORICET) 50-325-40 MG tablet Take 1 tablet by mouth 2 (two) times daily.   cetirizine (ZYRTEC) 10 MG tablet Take by mouth.   cyclobenzaprine  (FLEXERIL ) 10 MG tablet TAKE 1 TABLET BY MOUTH THREE TIMES A DAY AS NEEDED FOR MUSCLE SPASM   dapagliflozin propanediol (FARXIGA) 5 MG TABS tablet Take 1 tablet (5 mg total) by mouth daily.   fluticasone  (FLONASE ) 50 MCG/ACT nasal spray Place two sprays in each nostril once daily   MELATONIN PO Take by mouth.   metoprolol  tartrate (LOPRESSOR ) 25 MG tablet Take 1 tablet (25 mg total) by mouth 2 (two) times daily.   NON FORMULARY OmegaCo3   NON FORMULARY Liqua-D (K-87)   ondansetron  (ZOFRAN -ODT) 4 MG disintegrating tablet Take 1 tablet (4 mg total) by mouth every 8  (eight) hours as needed for nausea or vomiting.   OVER THE COUNTER MEDICATION Thyroid  support   OVER THE COUNTER MEDICATION Thyroid  support   pregabalin (LYRICA) 25 MG capsule Take 25 mg by mouth 2 (two) times daily.   Red Yeast Rice 600 MG CAPS Take 2,400 mg by mouth daily.   SUMAtriptan  (IMITREX ) 100 MG tablet Take 1 tablet (100 mg total) by mouth once for 1 dose. May repeat in 2 hours if headache persists or recurs.   tirzepatide  (MOUNJARO ) 7.5 MG/0.5ML Pen Inject 7.5 mg into the skin once a week.   [DISCONTINUED] atenolol  (TENORMIN ) 25 MG tablet Take 1 tablet (25 mg total) by mouth at bedtime.   [DISCONTINUED] sitaGLIPtin  (JANUVIA ) 25 MG tablet Take 25 mg by mouth daily.   [DISCONTINUED] tirzepatide  (MOUNJARO ) 5 MG/0.5ML Pen Inject 5 mg into the skin once a week.   No facility-administered encounter medications on file as of 06/20/2024.    Allergies  Allergen Reactions   Gabapentin Shortness Of Breath   Misc. Sulfonamide Containing Compounds Hives    REACTION: GI upset   Penicillins Hives  REACTION: childhood reaction wtih Scarlett Fever  Other reaction(s): Other (See Comments) unrecalled REACTION: childhood reaction wtih Scarlett Fever REACTION: childhood reaction wtih Scarlett Fever  Other Reaction(s): almost killed me   Sulfa Antibiotics Hives    REACTION: GI upset  Other Reaction(s): extremly sick   Nitrofurantoin Macrocrystal Other (See Comments)    Muscle spasms  Other reaction(s): Other, Other (See Comments) Muscle spasms  Muscle spasms   Codeine    Metformin Other (See Comments)    REACTION: diarrhea   Simvastatin Other (See Comments)   Sulfonamide Derivatives     REACTION: GI upset   Topiramate Other (See Comments)   Lisinopril Other (See Comments) and Cough    Pertinent ROS per HPI, otherwise unremarkable      Objective:  BP (!) 176/120   Pulse 83   Temp (!) 97.2 F (36.2 C)   Ht 5' 7 (1.702 m)   Wt 202 lb (91.6 kg)   SpO2 97%   BMI 31.64 kg/m     Wt Readings from Last 3 Encounters:  06/20/24 202 lb (91.6 kg)  02/22/24 179 lb 12.8 oz (81.6 kg)  01/20/24 190 lb 12.8 oz (86.5 kg)    Physical Exam Vitals and nursing note reviewed.  Constitutional:      General: She is not in acute distress.    Appearance: Normal appearance. She is well-developed and well-groomed. She is obese. She is not ill-appearing, toxic-appearing or diaphoretic.  HENT:     Head: Normocephalic and atraumatic.     Jaw: There is normal jaw occlusion.     Right Ear: Hearing normal.     Left Ear: Hearing normal.     Nose: Nose normal.     Mouth/Throat:     Lips: Pink.     Mouth: Mucous membranes are moist.     Pharynx: Oropharynx is clear. Uvula midline.  Eyes:     General: Lids are normal.     Extraocular Movements: Extraocular movements intact.     Conjunctiva/sclera: Conjunctivae normal.     Pupils: Pupils are equal, round, and reactive to light.  Neck:     Thyroid : No thyroid  mass, thyromegaly or thyroid  tenderness.     Vascular: No carotid bruit or JVD.     Trachea: Trachea and phonation normal.  Cardiovascular:     Rate and Rhythm: Normal rate and regular rhythm.     Chest Wall: PMI is not displaced.     Pulses: Normal pulses.     Heart sounds: Normal heart sounds. No murmur heard.    No friction rub. No gallop.  Pulmonary:     Effort: Pulmonary effort is normal. No respiratory distress.     Breath sounds: Normal breath sounds. No wheezing.  Abdominal:     General: Bowel sounds are normal. There is no distension or abdominal bruit.     Palpations: Abdomen is soft. There is no hepatomegaly or splenomegaly.     Tenderness: There is no abdominal tenderness. There is no right CVA tenderness or left CVA tenderness.     Hernia: No hernia is present.  Musculoskeletal:        General: Normal range of motion.     Cervical back: Normal range of motion and neck supple.     Right lower leg: No edema.     Left lower leg: No edema.  Lymphadenopathy:      Cervical: No cervical adenopathy.  Skin:    General: Skin is warm and dry.  Capillary Refill: Capillary refill takes less than 2 seconds.     Coloration: Skin is not cyanotic, jaundiced or pale.     Findings: No rash.  Neurological:     General: No focal deficit present.     Mental Status: She is alert and oriented to person, place, and time.     Sensory: Sensation is intact.     Motor: Motor function is intact.     Coordination: Coordination is intact.     Gait: Gait is intact.     Deep Tendon Reflexes: Reflexes are normal and symmetric.  Psychiatric:        Attention and Perception: Attention and perception normal.        Mood and Affect: Mood and affect normal.        Speech: Speech normal.        Behavior: Behavior normal. Behavior is cooperative.        Thought Content: Thought content normal.        Cognition and Memory: Cognition and memory normal.        Judgment: Judgment normal.     Results for orders placed or performed in visit on 02/22/24  Microscopic Examination   Collection Time: 02/22/24  3:08 PM   Urine  Result Value Ref Range   WBC, UA 0-5 0 - 5 /hpf   RBC, Urine 3-10 (A) 0 - 2 /hpf   Epithelial Cells (non renal) 0-10 0 - 10 /hpf   Renal Epithel, UA None seen None seen /hpf   Mucus, UA Present (A) Not Estab.   Bacteria, UA Few (A) None seen/Few   Yeast, UA None seen None seen  Urinalysis, Routine w reflex microscopic   Collection Time: 02/22/24  3:08 PM  Result Value Ref Range   Specific Gravity, UA 1.020 1.005 - 1.030   pH, UA 5.5 5.0 - 7.5   Color, UA Yellow Yellow   Appearance Ur Clear Clear   Leukocytes,UA Negative Negative   Protein,UA 2+ (A) Negative/Trace   Glucose, UA Negative Negative   Ketones, UA Trace (A) Negative   RBC, UA 2+ (A) Negative   Bilirubin, UA Negative Negative   Urobilinogen, Ur 0.2 0.2 - 1.0 mg/dL   Nitrite, UA Negative Negative   Microscopic Examination See below:   Acute Viral Hepatitis (HAV, HBV, HCV)    Collection Time: 02/22/24  3:12 PM  Result Value Ref Range   Hep A IgM Negative Negative   Hepatitis B Surface Ag Negative Negative   Hep B C IgM Negative Negative   HCV Ab Non Reactive Non Reactive  Interpretation:   Collection Time: 02/22/24  3:12 PM  Result Value Ref Range   HCV Interp 1: Comment   Anemia Profile B   Collection Time: 02/22/24  3:20 PM  Result Value Ref Range   Total Iron Binding Capacity 328 250 - 450 ug/dL   UIBC 755 868 - 574 ug/dL   Iron 84 27 - 840 ug/dL   Iron Saturation 26 15 - 55 %   Ferritin 1,138 (H) 15 - 150 ng/mL   Vitamin B-12 1,701 (H) 232 - 1,245 pg/mL   Folate 10.5 >3.0 ng/mL   WBC 7.2 3.4 - 10.8 x10E3/uL   RBC 5.47 (H) 3.77 - 5.28 x10E6/uL   Hemoglobin 15.3 11.1 - 15.9 g/dL   Hematocrit 53.5 65.9 - 46.6 %   MCV 85 79 - 97 fL   MCH 28.0 26.6 - 33.0 pg   MCHC 33.0 31.5 -  35.7 g/dL   RDW 87.1 88.2 - 84.5 %   Platelets 298 150 - 450 x10E3/uL   Neutrophils 37 Not Estab. %   Lymphs 54 Not Estab. %   Monocytes 8 Not Estab. %   Eos 0 Not Estab. %   Basos 1 Not Estab. %   Neutrophils Absolute 2.7 1.4 - 7.0 x10E3/uL   Lymphocytes Absolute 3.9 (H) 0.7 - 3.1 x10E3/uL   Monocytes Absolute 0.5 0.1 - 0.9 x10E3/uL   EOS (ABSOLUTE) 0.0 0.0 - 0.4 x10E3/uL   Basophils Absolute 0.1 0.0 - 0.2 x10E3/uL   Immature Granulocytes 0 Not Estab. %   Immature Grans (Abs) 0.0 0.0 - 0.1 x10E3/uL   Retic Ct Pct 1.4 0.6 - 2.6 %  Hormone Panel   Collection Time: 02/22/24  3:20 PM  Result Value Ref Range   TSH 5.4 (H) uU/mL   T4 12.5 ug/dL   Free T-3 3.6 pg/mL   Triiodothyronine (T-3), Serum 165 ng/dL   Testosterone, Serum (Total) 12 ng/dL   Free Testosterone, Serum 0.6 (L) pg/mL   Testosterone-% Free 0.5 %   Follicle Stimulating Hormone 36 mIU/mL   Progesterone, Serum <10 ng/dL   DHEA-Sulfate, LCMS <89 ug/dL   Sex Hormone Binding Globulin 153.0 (H) nmol/L   Estrone Sulfate <10 ng/dL   Estradiol, Serum, MS 4.9 pg/mL  CMP14+EGFR   Collection Time: 02/22/24   3:20 PM  Result Value Ref Range   Glucose 171 (H) 70 - 99 mg/dL   BUN 23 8 - 27 mg/dL   Creatinine, Ser 9.12 0.57 - 1.00 mg/dL   eGFR 76 >40 fO/fpw/8.26   BUN/Creatinine Ratio 26 12 - 28   Sodium 136 134 - 144 mmol/L   Potassium 4.2 3.5 - 5.2 mmol/L   Chloride 97 96 - 106 mmol/L   CO2 23 20 - 29 mmol/L   Calcium 9.0 8.7 - 10.3 mg/dL   Total Protein 7.5 6.0 - 8.5 g/dL   Albumin 4.0 3.8 - 4.9 g/dL   Globulin, Total 3.5 1.5 - 4.5 g/dL   Bilirubin Total 0.4 0.0 - 1.2 mg/dL   Alkaline Phosphatase 147 (H) 44 - 121 IU/L   AST 23 0 - 40 IU/L   ALT 29 0 - 32 IU/L       Pertinent labs & imaging results that were available during my care of the patient were reviewed by me and considered in my medical decision making.  Assessment & Plan:  Jenny Willis was seen today for hospitalization follow-up and medical management of chronic issues.  Diagnoses and all orders for this visit:  NSTEMI (non-ST elevated myocardial infarction) (HCC) -     tirzepatide  (MOUNJARO ) 7.5 MG/0.5ML Pen; Inject 7.5 mg into the skin once a week. -     CMP14+EGFR -     CBC with Differential/Platelet -     Lipid panel -     TSH -     T4, Free -     Bayer DCA Hb A1c Waived -     metoprolol  tartrate (LOPRESSOR ) 25 MG tablet; Take 1 tablet (25 mg total) by mouth 2 (two) times daily. -     dapagliflozin propanediol (FARXIGA) 5 MG TABS tablet; Take 1 tablet (5 mg total) by mouth daily. -     Ambulatory referral to Cardiology  Type 2 diabetes mellitus with other specified complication, without long-term current use of insulin  (HCC) -     tirzepatide  (MOUNJARO ) 7.5 MG/0.5ML Pen; Inject 7.5 mg into the skin once  a week. -     CMP14+EGFR -     CBC with Differential/Platelet -     Lipid panel -     TSH -     T4, Free -     Bayer DCA Hb A1c Waived -     Microalbumin / creatinine urine ratio -     dapagliflozin propanediol (FARXIGA) 5 MG TABS tablet; Take 1 tablet (5 mg total) by mouth daily. -     VITAMIN D  25  Hydroxy (Vit-D Deficiency, Fractures) -     Vitamin B12 -     Ambulatory referral to Cardiology  Hyperlipidemia associated with type 2 diabetes mellitus (HCC) -     tirzepatide  (MOUNJARO ) 7.5 MG/0.5ML Pen; Inject 7.5 mg into the skin once a week. -     CMP14+EGFR -     Lipid panel -     TSH -     T4, Free -     Bayer DCA Hb A1c Waived -     dapagliflozin propanediol (FARXIGA) 5 MG TABS tablet; Take 1 tablet (5 mg total) by mouth daily. -     Ambulatory referral to Cardiology  Hypertension associated with diabetes (HCC) -     tirzepatide  (MOUNJARO ) 7.5 MG/0.5ML Pen; Inject 7.5 mg into the skin once a week. -     CMP14+EGFR -     CBC with Differential/Platelet -     Lipid panel -     TSH -     T4, Free -     Bayer DCA Hb A1c Waived -     Microalbumin / creatinine urine ratio -     metoprolol  tartrate (LOPRESSOR ) 25 MG tablet; Take 1 tablet (25 mg total) by mouth 2 (two) times daily. -     dapagliflozin propanediol (FARXIGA) 5 MG TABS tablet; Take 1 tablet (5 mg total) by mouth daily. -     Ambulatory referral to Cardiology  Diabetic mononeuropathy associated with type 2 diabetes mellitus (HCC) -     CMP14+EGFR -     CBC with Differential/Platelet -     TSH -     T4, Free -     VITAMIN D  25 Hydroxy (Vit-D Deficiency, Fractures) -     Vitamin B12 -     Ambulatory referral to Cardiology  Vitamin D  deficiency -     CMP14+EGFR -     VITAMIN D  25 Hydroxy (Vit-D Deficiency, Fractures)  Aneurysm of ascending aorta without rupture -     CMP14+EGFR -     CBC with Differential/Platelet -     Lipid panel -     Ambulatory referral to Cardiology  Acquired hypothyroidism -     Lipid panel -     TSH -     T4, Free -     Vitamin B12        Type 2 diabetes mellitus with poor glycemic control A1c was 8.8 in the hospital, improved from 11. Blood sugars at home range from 165 to 185 mg/dL. Current regimen includes Januvia  and Mounjaro , but Mounjaro  at 5 mg is not effective. She is  motivated to improve glycemic control and is open to medication adjustments. - Discontinued Januvia . - Increased Mounjaro  to 7.5 mg weekly. - Initiated Farxiga for kidney protection and glycemic control.  Non-ST elevation myocardial infarction (NSTEMI) and hypertensive urgency Recent hospitalization for NSTEMI and hypertensive urgency. Troponins trended up, indicating cardiac involvement. No recurrent chest pain or exertional symptoms reported. She is active  and walking regularly without issues. - Referred to cardiology for further evaluation and management.  Hypertension Blood pressure has been as high as 185 mmHg. Previous medications included atenolol  and hydrochlorothiazide, which were not well tolerated. She is open to trying metoprolol , which she has used before. - Discontinued atenolol . - Initiated metoprolol  25 mg twice daily as needed for blood pressure above 150 mmHg.  Obesity She is actively trying to lose weight and improve fitness. Walking regularly and making dietary changes, including reducing sugar intake and trying keto bread. She is motivated to continue these efforts. - Continue current exercise regimen and dietary modifications.  Hypothyroidism Currently taking thyroid  medication at night. Discussed potential interactions with supplements and iron. She is advised to take thyroid  medication on an empty stomach and avoid supplements for 3-4 hours before and after. - Continue current thyroid  medication regimen. - Avoid taking supplements within 3-4 hours of thyroid  medication.  Cataracts, bilateral, planned for surgery Bilateral cataracts with early changes in both eyes. Surgery is planned but delayed due to recent health issues. She is motivated to proceed with surgery once health is stabilized. - Proceed with cataract surgery once health is stabilized.  Aneurysm of ascending aorta, without rupture Previous evaluation showed no significant enlargement.           Continue all other maintenance medications.  Follow up plan: Return in about 2 weeks (around 07/04/2024), or if symptoms worsen or fail to improve, for HTN.   Continue healthy lifestyle choices, including diet (rich in fruits, vegetables, and lean proteins, and low in salt and simple carbohydrates) and exercise (at least 30 minutes of moderate physical activity daily).  Educational handout given for DM, DASH  The above assessment and management plan was discussed with the patient. The patient verbalized understanding of and has agreed to the management plan. Patient is aware to call the clinic if they develop any new symptoms or if symptoms persist or worsen. Patient is aware when to return to the clinic for a follow-up visit. Patient educated on when it is appropriate to go to the emergency department.   Rosaline Bruns, FNP-C [facility] [phone]

## 2024-06-28 NOTE — Progress Notes (Signed)
 Cardiology Office Note   Date:  07/03/2024  ID:  TARISSA KERIN, DOB [birth year], MRN [id number] PCP: Severa Rock HERO, FNP  [facility] Providers Cardiologist:  Lynwood Schilling, MD     History of Present Illness Jenny Willis is a 58 y.o. female with a past medical history of mild CAD noted on CT imaging, hypertension, DM2, dyslipidemia, OSA on CPAP.   05/26/2024 MPI normal perfusion 05/26/2024 renal duplex no evidence of hemodynamically significant RAS 05/25/2024 echo EF 55 to 60%, mild aortic valve sclerosis, trace MR, trace TR  Previously cared by [facility] cardiology and initially established care with them in 2021 for evaluation of atypical chest pain and tachycardia.  An echocardiogram was arranged revealing EF of 60%, trace AR, trivial pericardial effusion.  A stress evaluation was arranged revealing no evidence of ischemia.    She transferred care to Dr. 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  Most recently she was admitted to [facility] on 05/24/2024 with hypertensive crisis and type II non-STEMI TnT-Gen 5 are 43> 69 (26) > 75 (32)  >> started on a Cardene drip, given nitroglycerin, she underwent echocardiogram revealing a normal EF, mild aortic valve sclerosis, she underwent a renal duplex revealing no evidence of RAS, she underwent MPI which revealed no evidence of ischemia.  It was suggested she start HCTZ and nifedipine at discharge however there is notation indicating she preferred to take her herbal supplements.  She presents today for follow-up, doing good overall.  She tells me she prefers a holistic approach to her health care and she has side effects to basically all antihypertensive agents she has tried in the past, we tried to review the current  medication she has tried but she just cannot remember all of the medicines but states she has not been able to tolerate any of them.  She is currently only taking metoprolol  25 mg twice daily.  She checks her blood pressure both with a wrist cuff and a upper arm blood pressure cuff and gets various readings at home, she does indicates she plans to get a new blood pressure cuff.  She follows closely with her PCP and is seeing them since hospital discharge.  We talked about her type II non-STEMI felt to be secondary to hypertensive crisis, she did have an MPI which was negative for ischemia.  She has not taken the medication she was prescribed at discharge.  She has strong feelings about lipid-lowering therapy, but agrees for repeat testing of her lab work.  She has worked hard to lose a significant amount of weight. She denies chest pain, palpitations, dyspnea, pnd, orthopnea, n, v, dizziness, syncope, edema, weight gain, or early satiety.   Previously tried antihypertensive agents: Lisinopril-cough Losartan -cannot recall reaction Atenolol -cannot recall reaction Amlodipine -cannot recall reaction Nifedipine - cannot recall Hydrochlorothiazide - cannot recall Spiro - not tried but several potassium levels ~ 4.4    ROS: Review of Systems  Constitutional:  Positive for malaise/fatigue.  Musculoskeletal:  Positive for back pain and joint pain.  All other systems reviewed and are negative.    Studies Reviewed EKG Interpretation Date/Time:  Friday June 30 2024 15:01:57 EST Ventricular Rate:  72 PR Interval:  164 QRS Duration:  80 QT Interval:  394 QTC Calculation: 431 R Axis:  58  Text Interpretation: Normal sinus rhythm Normal ECG When compared with ECG of 25-Jul-2008 00:11, T wave amplitude has decreased in Lateral leads QT has shortened Confirmed by Carlin Nest [id number] on 06/30/2024 3:06:39 PM     Risk Assessment/Calculations   HYPERTENSION CONTROL Vitals:   06/30/24 1459  07/03/24 0938 07/03/24 0939  BP: (!) 150/86 (!) 150/78 (!) 150/78    The patient's blood pressure is elevated above target today.  In order to address the patient's elevated BP: Blood pressure will be monitored at home to determine if medication changes need to be made.; A referral to the PharmD Hypertension Clinic will be placed.          Physical Exam VS:  BP (!) 150/78   Pulse 74   Ht 5' 7 (1.702 m)   Wt 196 lb 12.8 oz (89.3 kg)   SpO2 96%   BMI 30.82 kg/m        Wt Readings from Last 3 Encounters:  06/30/24 196 lb 12.8 oz (89.3 kg)  06/20/24 202 lb (91.6 kg)  02/22/24 179 lb 12.8 oz (81.6 kg)    GEN: Well nourished, well developed in no acute distress NECK: No JVD; No carotid bruits CARDIAC: RRR, no murmurs, rubs, gallops RESPIRATORY:  Clear to auscultation without rales, wheezing or rhonchi  ABDOMEN: Soft, non-tender, non-distended EXTREMITIES:  No edema; No deformity   ASSESSMENT AND PLAN Hypertension-blood pressure is elevated at 150/78, currently only on metoprolol  25 mg twice daily.  Recent hospitalization for hypertensive crisis and type II non-STEMI.  Can refer her to Pharm.D. for further antihypertensive management as she has been intolerant of numerous medications.  Will have her keep a log of her blood pressure and encouraged her to purchase a new blood pressure cuff today. I will look through her chart and try to decipher what medications she has tried and make recommendations after that.   Type II non-STEMI- troponin minimally elevated >> MPI in the hospital was low risk. Will arrange for coronary CTA. Premedicate with metoprolol  100 mg PO prior to exam. CMET today.   DM2-will repeat A1c since we are checking other lab work and forward to her PCP.  Dyslipidemia - LDL 169 and uncontrolled, will check LP(a).   OSA on CPAP - did not address/inquire about compliance at this OV.         Dispo: BP log, purchase new BP cuff, CMET, A1C, LP(a),  coronary cta,  metoprolol  100 mg po prior to ccta, refer to PharmD for hypertension management. Follow up with gen cards 6 months.   Signed, Nest JAYSON Carlin, NP

## 2024-06-30 ENCOUNTER — Encounter: Payer: Self-pay | Admitting: Cardiology

## 2024-06-30 ENCOUNTER — Ambulatory Visit: Payer: Medicare (Managed Care) | Attending: Cardiology | Admitting: Cardiology

## 2024-06-30 ENCOUNTER — Other Ambulatory Visit (HOSPITAL_COMMUNITY): Payer: Self-pay

## 2024-06-30 ENCOUNTER — Encounter: Payer: Self-pay | Admitting: Family Medicine

## 2024-06-30 VITALS — BP 150/78 | HR 74 | Ht 67.0 in | Wt 196.8 lb

## 2024-06-30 DIAGNOSIS — I7121 Aneurysm of the ascending aorta, without rupture: Secondary | ICD-10-CM | POA: Diagnosis not present

## 2024-06-30 DIAGNOSIS — R7989 Other specified abnormal findings of blood chemistry: Secondary | ICD-10-CM

## 2024-06-30 DIAGNOSIS — I10 Essential (primary) hypertension: Secondary | ICD-10-CM

## 2024-06-30 DIAGNOSIS — R079 Chest pain, unspecified: Secondary | ICD-10-CM | POA: Diagnosis not present

## 2024-06-30 DIAGNOSIS — E1169 Type 2 diabetes mellitus with other specified complication: Secondary | ICD-10-CM

## 2024-06-30 MED ORDER — METOPROLOL TARTRATE 100 MG PO TABS: ORAL_TABLET | ORAL | 0 refills | Status: DC

## 2024-06-30 MED ORDER — OMRON 3 SERIES BP MONITOR DEVI
1.0000 | Freq: Every day | 0 refills | Status: AC
  Filled 2024-06-30: qty 1, 30d supply, fill #0

## 2024-06-30 NOTE — Patient Instructions (Addendum)
 Medication Instructions:  Your physician recommends that you continue on your current medications as directed. Please refer to the Current Medication list given to you today.  *If you need a refill on your cardiac medications before your next appointment, please call your pharmacy*  Lab Work: CMET, Lipoprotein (a), A1C  If you have labs (blood work) drawn today and your tests are completely normal, you will receive your results only by: MyChart Message (if you have MyChart) OR A paper copy in the mail If you have any lab test that is abnormal or we need to change your treatment, we will call you to review the results.  Testing/Procedures: CORONARY CTA    Your cardiac CT will be scheduled at   [facility] [address]  [place], [place] [address]   At the Heart and Vascular Tower at [address], please enter the parking lot using the [address] entrance and use the FREE valet service at the patient drop-off area. Enter the building and check-in with registration on the main floor.  Please follow these instructions carefully (unless otherwise directed):  An IV will be required for this test and Nitroglycerin will be given.   On the Night Before the Test: Be sure to Drink plenty of water. Do not consume any caffeinated/decaffeinated beverages or chocolate 12 hours prior to your test. Do not take any antihistamines 12 hours prior to your test.  On the Day of the Test: Drink plenty of water until 1 hour prior to the test. Do not eat any food 1 hour prior to test. You may take your regular medications prior to the test.  Take metoprolol  (Lopressor ) two hours prior to test. Patients who wear a continuous glucose monitor MUST remove the device prior to scanning. FEMALES- please wear underwire-free bra if available, avoid dresses & tight clothing     After the Test: Drink plenty of water. After receiving IV contrast, you may experience a mild  flushed feeling. This is normal. On occasion, you may experience a mild rash up to 24 hours after the test. This is not dangerous. If this occurs, you can take Benadryl 25 mg, Zyrtec, Claritin, or Allegra and increase your fluid intake. (Patients taking Tikosyn should avoid Benadryl, and may take Zyrtec, Claritin, or Allegra) If you experience trouble breathing, this can be serious. If it is severe call 911 IMMEDIATELY. If it is mild, please call our office.  We will call to schedule your test 2-4 weeks out understanding that some insurance companies will need an authorization prior to the service being performed.   For more information and frequently asked questions, please visit our website : [email or web address]  For non-scheduling related questions, please contact the cardiac imaging nurse navigator should you have any questions/concerns: Cardiac Imaging Nurse Navigators Direct Office Dial: [phone]   For scheduling needs, including cancellations and rescheduling, please call Brittany, [phone].  Follow-Up: At [facility], you and your health needs are our priority.  As part of our continuing mission to provide you with exceptional heart care, our providers are all part of one team.  This team includes your primary Cardiologist (physician) and Advanced Practice Providers or APPs (Physician Assistants and Nurse Practitioners) who all work together to provide you with the care you need, when you need it.  Your next appointment:   With our Pharm-D department   We recommend signing up for the patient portal called MyChart.  Sign up information is provided on this After  Visit Summary.  MyChart is used to connect with patients for Virtual Visits (Telemedicine).  Patients are able to view lab/test results, encounter notes, upcoming appointments, etc.  Non-urgent messages can be sent to your provider as well.   To learn more about what you can do with MyChart, go to  [email or web address].   Other Instructions  Your physician has requested that you regularly monitor and record your blood pressure readings at home. Please use the same machine at the same time of day to check your readings and record them to bring to your follow-up visit.   Please monitor blood pressures and keep a log of your readings for two weeks.    Make sure to check 2 hours after your medications.    AVOID these things for 30 minutes before checking your blood pressure: No Drinking caffeine. No Drinking alcohol. No Eating. No Smoking. No Exercising.   Five minutes before checking your blood pressure: Pee. Sit in a dining chair. Avoid sitting in a soft couch or armchair. Be quiet. Do not talk

## 2024-07-03 ENCOUNTER — Ambulatory Visit: Payer: Self-pay | Admitting: Cardiology

## 2024-07-03 ENCOUNTER — Encounter: Payer: Self-pay | Admitting: Cardiology

## 2024-07-04 ENCOUNTER — Ambulatory Visit: Payer: Medicare (Managed Care) | Admitting: Family Medicine

## 2024-07-04 DIAGNOSIS — E1159 Type 2 diabetes mellitus with other circulatory complications: Secondary | ICD-10-CM

## 2024-07-04 LAB — COMPREHENSIVE METABOLIC PANEL WITH GFR
ALT: 14 IU/L (ref 0–32)
AST: 19 IU/L (ref 0–40)
Albumin: 4.6 g/dL (ref 3.8–4.9)
Alkaline Phosphatase: 97 IU/L (ref 49–135)
BUN/Creatinine Ratio: 21 (ref 12–28)
BUN: 19 mg/dL (ref 8–27)
Bilirubin Total: 0.4 mg/dL (ref 0.0–1.2)
CO2: 22 mmol/L (ref 20–29)
Calcium: 9.7 mg/dL (ref 8.7–10.3)
Chloride: 100 mmol/L (ref 96–106)
Creatinine, Ser: 0.92 mg/dL (ref 0.57–1.00)
Globulin, Total: 2.9 g/dL (ref 1.5–4.5)
Glucose: 143 mg/dL — ABNORMAL HIGH (ref 70–99)
Potassium: 4.1 mmol/L (ref 3.5–5.2)
Sodium: 138 mmol/L (ref 134–144)
Total Protein: 7.5 g/dL (ref 6.0–8.5)
eGFR: 71 mL/min/1.73

## 2024-07-04 LAB — HEMOGLOBIN A1C
Est. average glucose Bld gHb Est-mCnc: 237 mg/dL
Hgb A1c MFr Bld: 9.9 % — ABNORMAL HIGH (ref 4.8–5.6)

## 2024-07-04 LAB — LIPOPROTEIN A (LPA): Lipoprotein (a): 11.3 nmol/L

## 2024-07-04 MED ORDER — HYDRALAZINE HCL 10 MG PO TABS: 10.0000 mg | ORAL_TABLET | Freq: Three times a day (TID) | ORAL | 3 refills | Status: AC

## 2024-07-04 NOTE — Telephone Encounter (Signed)
 Spoke with pt regarding lab results. Pt verbalized understanding. Pt was advised per Delon Hoover try a very low dose of hydralazine 10 mg three times/day until she can meet with PharmD. Pt agreed. Medication sent to [facility] per pt request. Pt stated she has a new BP cuff, but her BP is still running high. Pt was advised to pick up new medication as soon as possible. Pt stated she does not have transportation and she will pick up tomorrow. Pt had no further questions at this time.

## 2024-07-05 ENCOUNTER — Encounter: Payer: Self-pay | Admitting: Family Medicine

## 2024-07-11 NOTE — Progress Notes (Unsigned)
 Patient ID: Jenny Willis                 DOB: 10/14/1965                      MRN: [id number]      HPI: Jenny Willis is a 58 y.o. female referred by Delon Hoover to HTN clinic. PMH is significant for T2DM, HLD, OSA on CPAP. At last visit, blood pressure recorded at 150/78 with HR 74. Patient had recent NSTEMI on 10/8 likely due to hypertensive emergency, and was instructed to bring home readings to next visit.    Current HTN meds: hydralazine  10 mg TID, metoprolol  25 mg BID  Previously tried: lisinopril (cough). Patient is unsure what happened with losartan , atenolol , nifedipine, hydrochlorothiazide, and spironolactone but patient reports not tolerating them.  BP goal: < 130/80   Family History:  Family History  Problem Relation Age of Onset   COPD Mother    Cancer Mother    COPD Father    Cancer Father    Breast cancer Cousin    Breast cancer Cousin    Breast cancer Cousin    Breast cancer Cousin    Cancer Other    Hypertension Other    Diabetes Other    Hyperlipidemia Other     Social History:   Diet:   Exercise:  {types:28256}  Home BP readings:  Date SBP/DBP  HR              Average      Wt Readings from Last 3 Encounters:  06/30/24 196 lb 12.8 oz (89.3 kg)  06/20/24 202 lb (91.6 kg)  02/22/24 179 lb 12.8 oz (81.6 kg)   BP Readings from Last 3 Encounters:  07/03/24 (!) 150/78  06/20/24 (!) 176/120  05/31/24 (!) 145/96   Pulse Readings from Last 3 Encounters:  06/30/24 74  06/20/24 83  05/31/24 75    Renal function: Estimated Creatinine Clearance: 74.6 mL/min (by C-G formula based on SCr of 0.92 mg/dL).  Past Medical History:  Diagnosis Date   Diabetes mellitus without complication (HCC)    Hyperlipidemia    Hypertension    Thyroid  disease     Current Outpatient Medications on File Prior to Visit  Medication Sig Dispense Refill   Albuterol -Budesonide (AIRSUPRA ) 90-80 MCG/ACT AERO Inhale 2 puffs into the lungs every 4 (four) hours as  needed. 10.7 g 11   aspirin 81 MG chewable tablet Chew by mouth.     Blood Pressure Monitoring (OMRON 3 SERIES BP MONITOR) DEVI Use to self monitor blood pressure as directed. 1 each 0   butalbital-acetaminophen-caffeine (FIORICET) 50-325-40 MG tablet Take 1 tablet by mouth 2 (two) times daily.     cetirizine (ZYRTEC) 10 MG tablet Take by mouth.     cyclobenzaprine  (FLEXERIL ) 10 MG tablet TAKE 1 TABLET BY MOUTH THREE TIMES A DAY AS NEEDED FOR MUSCLE SPASM 90 tablet 0   dapagliflozin  propanediol (FARXIGA ) 5 MG TABS tablet Take 1 tablet (5 mg total) by mouth daily. 90 tablet 3   fluticasone  (FLONASE ) 50 MCG/ACT nasal spray Place two sprays in each nostril once daily 16 g 1   hydrALAZINE  (APRESOLINE ) 10 MG tablet Take 1 tablet (10 mg total) by mouth 3 (three) times daily. 270 tablet 3   MELATONIN PO Take by mouth.     metoprolol  tartrate (LOPRESSOR ) 100 MG tablet Take one (1) 100 mg tablet 2 hours prior to scan 1 tablet 0  metoprolol  tartrate (LOPRESSOR ) 25 MG tablet Take 1 tablet (25 mg total) by mouth 2 (two) times daily. 180 tablet 3   NON FORMULARY OmegaCo3     NON FORMULARY Liqua-D (K-87)     ondansetron  (ZOFRAN -ODT) 4 MG disintegrating tablet Take 1 tablet (4 mg total) by mouth every 8 (eight) hours as needed for nausea or vomiting. 20 tablet 0   OVER THE COUNTER MEDICATION Thyroid  support     OVER THE COUNTER MEDICATION Thyroid  support     pregabalin (LYRICA) 25 MG capsule Take 25 mg by mouth 2 (two) times daily.     Red Yeast Rice 600 MG CAPS Take 2,400 mg by mouth daily.     SUMAtriptan  (IMITREX ) 100 MG tablet Take 1 tablet (100 mg total) by mouth once for 1 dose. May repeat in 2 hours if headache persists or recurs. 9 tablet 11   tirzepatide  (MOUNJARO ) 7.5 MG/0.5ML Pen Inject 7.5 mg into the skin once a week. 6 mL 3   No current facility-administered medications on file prior to visit.    Allergies  Allergen Reactions   Gabapentin Shortness Of Breath   Misc. Sulfonamide Containing  Compounds Hives    REACTION: GI upset   Penicillins Hives    REACTION: childhood reaction wtih Scarlett Fever  Other reaction(s): Other (See Comments) unrecalled REACTION: childhood reaction wtih Scarlett Fever REACTION: childhood reaction wtih Scarlett Fever  Other Reaction(s): almost killed me   Sulfa Antibiotics Hives    REACTION: GI upset  Other Reaction(s): extremly sick   Nitrofurantoin Macrocrystal Other (See Comments)    Muscle spasms  Other reaction(s): Other, Other (See Comments) Muscle spasms  Muscle spasms   Codeine    Metformin Other (See Comments)    REACTION: diarrhea   Simvastatin Other (See Comments)   Sulfonamide Derivatives     REACTION: GI upset   Topiramate Other (See Comments)   Lisinopril Other (See Comments) and Cough    There were no vitals taken for this visit.   Assessment/Plan:  1. Hypertension -  No problems updated. No problem-specific Assessment & Plan notes found for this encounter.     Thank you,   Wilbern Pennypacker, Pharm.D Candidate   Robbi Blanch, [address].D [place] Elspeth BIRCH. [facility] [address], [place], [place] [address] Phone: [phone]; Fax: [phone]

## 2024-07-12 ENCOUNTER — Ambulatory Visit: Payer: Medicare (Managed Care) | Attending: Pharmacist | Admitting: Pharmacist

## 2024-07-25 ENCOUNTER — Encounter (HOSPITAL_COMMUNITY): Payer: Self-pay

## 2024-07-27 ENCOUNTER — Other Ambulatory Visit: Payer: Self-pay | Admitting: Cardiology

## 2024-07-27 ENCOUNTER — Ambulatory Visit (HOSPITAL_BASED_OUTPATIENT_CLINIC_OR_DEPARTMENT_OTHER)
Admission: RE | Admit: 2024-07-27 | Discharge: 2024-07-27 | Disposition: A | Discharge status: Home / Self Care | Payer: Medicare (Managed Care) | Source: Ambulatory Visit | Attending: Cardiology | Admitting: Cardiology

## 2024-07-27 ENCOUNTER — Ambulatory Visit (HOSPITAL_COMMUNITY)
Admission: RE | Admit: 2024-07-27 | Discharge: 2024-07-27 | Disposition: A | Discharge status: Home / Self Care | Payer: Medicare (Managed Care) | Source: Ambulatory Visit | Attending: Internal Medicine | Admitting: Internal Medicine

## 2024-07-27 ENCOUNTER — Encounter: Payer: Medicare (Managed Care) | Admitting: Family Medicine

## 2024-07-27 DIAGNOSIS — I2510 Atherosclerotic heart disease of native coronary artery without angina pectoris: Secondary | ICD-10-CM | POA: Diagnosis not present

## 2024-07-27 DIAGNOSIS — R931 Abnormal findings on diagnostic imaging of heart and coronary circulation: Secondary | ICD-10-CM

## 2024-07-27 DIAGNOSIS — R079 Chest pain, unspecified: Secondary | ICD-10-CM | POA: Insufficient documentation

## 2024-07-27 MED ORDER — IOHEXOL 350 MG/ML SOLN
100.0000 mL | Freq: Once | INTRAVENOUS | Status: AC | PRN
  Administered 2024-07-27: 100 mL via INTRAVENOUS

## 2024-07-27 MED ORDER — NITROGLYCERIN 0.4 MG SL SUBL
0.8000 mg | SUBLINGUAL_TABLET | Freq: Once | SUBLINGUAL | Status: AC
  Administered 2024-07-27: 0.8 mg via SUBLINGUAL

## 2024-07-31 ENCOUNTER — Other Ambulatory Visit: Payer: Medicare (Managed Care)

## 2024-07-31 DIAGNOSIS — I152 Hypertension secondary to endocrine disorders: Secondary | ICD-10-CM | POA: Diagnosis not present

## 2024-07-31 DIAGNOSIS — E559 Vitamin D deficiency, unspecified: Secondary | ICD-10-CM | POA: Diagnosis not present

## 2024-07-31 DIAGNOSIS — E039 Hypothyroidism, unspecified: Secondary | ICD-10-CM | POA: Diagnosis not present

## 2024-07-31 DIAGNOSIS — I7121 Aneurysm of the ascending aorta, without rupture: Secondary | ICD-10-CM | POA: Diagnosis not present

## 2024-07-31 DIAGNOSIS — E1141 Type 2 diabetes mellitus with diabetic mononeuropathy: Secondary | ICD-10-CM | POA: Diagnosis not present

## 2024-07-31 DIAGNOSIS — E1169 Type 2 diabetes mellitus with other specified complication: Secondary | ICD-10-CM | POA: Diagnosis not present

## 2024-07-31 DIAGNOSIS — E1159 Type 2 diabetes mellitus with other circulatory complications: Secondary | ICD-10-CM | POA: Diagnosis not present

## 2024-07-31 DIAGNOSIS — E785 Hyperlipidemia, unspecified: Secondary | ICD-10-CM | POA: Diagnosis not present

## 2024-07-31 LAB — BAYER DCA HB A1C WAIVED: HB A1C (BAYER DCA - WAIVED): 7.3 % — ABNORMAL HIGH (ref 4.8–5.6)

## 2024-08-01 ENCOUNTER — Ambulatory Visit: Payer: Self-pay | Admitting: Family Medicine

## 2024-08-01 LAB — CMP14+EGFR
ALT: 11 IU/L (ref 0–32)
AST: 14 IU/L (ref 0–40)
Albumin: 4.3 g/dL (ref 3.8–4.9)
Alkaline Phosphatase: 98 IU/L (ref 49–135)
BUN/Creatinine Ratio: 16 (ref 12–28)
BUN: 11 mg/dL (ref 8–27)
Bilirubin Total: 0.4 mg/dL (ref 0.0–1.2)
CO2: 21 mmol/L (ref 20–29)
Calcium: 9.5 mg/dL (ref 8.7–10.3)
Chloride: 104 mmol/L (ref 96–106)
Creatinine, Ser: 0.68 mg/dL (ref 0.57–1.00)
Globulin, Total: 2.6 g/dL (ref 1.5–4.5)
Glucose: 120 mg/dL — ABNORMAL HIGH (ref 70–99)
Potassium: 4.4 mmol/L (ref 3.5–5.2)
Sodium: 140 mmol/L (ref 134–144)
Total Protein: 6.9 g/dL (ref 6.0–8.5)
eGFR: 100 mL/min/1.73 (ref >59)

## 2024-08-01 LAB — CBC WITH DIFFERENTIAL/PLATELET
Basophils Absolute: 0.1 x10E3/uL (ref 0.0–0.2)
Basos: 1 %
EOS (ABSOLUTE): 0.1 x10E3/uL (ref 0.0–0.4)
Eos: 2 %
Hematocrit: 42.6 % (ref 34.0–46.6)
Hemoglobin: 13.9 g/dL (ref 11.1–15.9)
Immature Grans (Abs): 0 x10E3/uL (ref 0.0–0.1)
Immature Granulocytes: 0 %
Lymphocytes Absolute: 3.1 x10E3/uL (ref 0.7–3.1)
Lymphs: 46 %
MCH: 27.7 pg (ref 26.6–33.0)
MCHC: 32.6 g/dL (ref 31.5–35.7)
MCV: 85 fL (ref 79–97)
Monocytes Absolute: 0.5 x10E3/uL (ref 0.1–0.9)
Monocytes: 7 %
Neutrophils Absolute: 3 x10E3/uL (ref 1.4–7.0)
Neutrophils: 44 %
Platelets: 302 x10E3/uL (ref 150–450)
RBC: 5.01 x10E6/uL (ref 3.77–5.28)
RDW: 12.5 % (ref 11.7–15.4)
WBC: 6.6 x10E3/uL (ref 3.4–10.8)

## 2024-08-01 LAB — MICROALBUMIN / CREATININE URINE RATIO
Creatinine, Urine: 65.2 mg/dL
Microalb/Creat Ratio: 15 mg/g{creat} (ref 0–29)
Microalbumin, Urine: 9.7 ug/mL

## 2024-08-01 LAB — VITAMIN B12: Vitamin B-12: 903 pg/mL (ref 232–1245)

## 2024-08-01 LAB — LIPID PANEL
Chol/HDL Ratio: 4.7 ratio — ABNORMAL HIGH (ref 0.0–4.4)
Cholesterol, Total: 198 mg/dL (ref 100–199)
HDL: 42 mg/dL (ref >39)
LDL Chol Calc (NIH): 126 mg/dL — ABNORMAL HIGH (ref 0–99)
Triglycerides: 171 mg/dL — ABNORMAL HIGH (ref 0–149)
VLDL Cholesterol Cal: 30 mg/dL (ref 5–40)

## 2024-08-01 LAB — TSH: TSH: 4.81 u[IU]/mL — ABNORMAL HIGH (ref 0.450–4.500)

## 2024-08-01 LAB — T4, FREE: Free T4: 0.85 ng/dL (ref 0.82–1.77)

## 2024-08-01 LAB — VITAMIN D 25 HYDROXY (VIT D DEFICIENCY, FRACTURES): Vit D, 25-Hydroxy: 31.4 ng/mL (ref 30.0–100.0)

## 2024-08-02 ENCOUNTER — Ambulatory Visit: Payer: Medicare (Managed Care) | Admitting: Family Medicine

## 2024-08-02 ENCOUNTER — Encounter: Payer: Self-pay | Admitting: Family Medicine

## 2024-08-02 VITALS — BP 138/84 | HR 81 | Temp 97.5°F | Ht 67.0 in | Wt 200.4 lb

## 2024-08-02 DIAGNOSIS — I214 Non-ST elevation (NSTEMI) myocardial infarction: Secondary | ICD-10-CM

## 2024-08-02 DIAGNOSIS — G8929 Other chronic pain: Secondary | ICD-10-CM | POA: Insufficient documentation

## 2024-08-02 DIAGNOSIS — E1159 Type 2 diabetes mellitus with other circulatory complications: Secondary | ICD-10-CM

## 2024-08-02 DIAGNOSIS — E039 Hypothyroidism, unspecified: Secondary | ICD-10-CM

## 2024-08-02 DIAGNOSIS — M545 Low back pain, unspecified: Secondary | ICD-10-CM

## 2024-08-02 DIAGNOSIS — E785 Hyperlipidemia, unspecified: Secondary | ICD-10-CM | POA: Diagnosis not present

## 2024-08-02 DIAGNOSIS — E1169 Type 2 diabetes mellitus with other specified complication: Secondary | ICD-10-CM

## 2024-08-02 DIAGNOSIS — I152 Hypertension secondary to endocrine disorders: Secondary | ICD-10-CM

## 2024-08-02 MED ORDER — METOPROLOL TARTRATE 25 MG PO TABS: 25.0000 mg | ORAL_TABLET | Freq: Every day | ORAL | 3 refills | Status: AC

## 2024-08-02 MED ORDER — TIRZEPATIDE 5 MG/0.5ML ~~LOC~~ SOAJ: 5.0000 mg | SUBCUTANEOUS | 3 refills | Status: AC

## 2024-08-02 NOTE — Progress Notes (Signed)
 Subjective:  Patient ID: Jenny Willis, female    DOB: 10-02-1965, 58 y.o.   MRN: [id number]  Patient Care Team: Severa Rock HERO, FNP as PCP - General (Family Medicine) Lynwood Schilling, MD as PCP - Cardiology (Cardiology)   Chief Complaint:  Results (Review CT results and labs )   HPI: Jenny Willis is a 58 y.o. female presenting on 08/02/2024 for Results (Review CT results and labs )    Jenny Willis is a 58 year old female with a history of non-ST elevation myocardial infarction who presents for follow-up regarding her cardiac health and medication management.  She was diagnosed with a non-ST elevation myocardial infarction at [facility] where her troponin levels trended upward. A nuclear medicine cardiolite study was performed during her hospitalization. A CT coronary angiogram showed minimal stenosis in the left anterior descending artery and other small arteries.  She is currently experiencing chest pain and shortness of breath. She was on metoprolol , which was ineffective in controlling her blood pressure, reaching levels of 175-220 mmHg. She has since been prescribed hydralazine  twice a day, leading to improved symptoms, including reduced shortness of breath and no longer needing her inhaler.  She is taking Mounjaro  for diabetes, but at a dose of 7.5 mg, she experiences vomiting and nausea for two days post-injection. Her A1c has improved from 9.9% to 7.3%. Her glucose levels are generally between 115-125 mg/dL, occasionally rising to 155 mg/dL after poor dietary choices.  She has a history of hypothyroidism and has been taking thyroid  support supplements, but is considering returning to levothyroxine  due to perceived ineffectiveness of the supplements. She reports weight fluctuations and gastrointestinal side effects from Mounjaro .  She is actively managing her health through diet and exercise, including Tai Chi and using a shaper machine to manage  neuropathy in her legs. She aims to walk five miles a day by November 1st, depending on her back pain. She uses a wheelchair for long distances due to back pain and is considering stem cell therapy for her back. She inquires about obtaining handicap parking paperwork due to her back issues.          Relevant past medical, surgical, family, and social history reviewed and updated as indicated.  Allergies and medications reviewed and updated. Data reviewed: Chart in Epic.   Past Medical History:  Diagnosis Date   Diabetes mellitus without complication (HCC)    Hyperlipidemia    Hypertension    Thyroid  disease     Past Surgical History:  Procedure Laterality Date   ABDOMINAL HYSTERECTOMY     BREAST BIOPSY Left    CYSTECTOMY Left     Social History   Socioeconomic History   Marital status: Married    Spouse name: Not on file   Number of children: Not on file   Years of education: Not on file   Highest education level: Not on file  Occupational History   Not on file  Tobacco Use   Smoking status: Former   Smokeless tobacco: Never  Substance and Sexual Activity   Alcohol use: No   Drug use: No   Sexual activity: Yes  Other Topics Concern   Not on file  Social History Narrative   Not on file   Social Drivers of Health   Tobacco Use: Medium Risk (08/02/2024)   Patient History    Smoking Tobacco Use: Former    Smokeless Tobacco Use: Never    Passive Exposure: Not on  file  Financial Resource Strain: Patient Declined (08/02/2024)   Overall Financial Resource Strain (CARDIA)    Difficulty of Paying Living Expenses: Patient declined  Food Insecurity: Patient Declined (08/02/2024)   Epic    Worried About Programme Researcher, Broadcasting/film/video in the Last Year: Patient declined    Barista in the Last Year: Patient declined  Transportation Needs: Patient Declined (08/02/2024)   Epic    Lack of Transportation (Medical): Patient declined    Lack of Transportation (Non-Medical):  Patient declined  Physical Activity: Sufficiently Active (08/02/2024)   Exercise Vital Sign    Days of Exercise per Week: 5 days    Minutes of Exercise per Session: 30 min  Stress: No Stress Concern Present (08/02/2024)   [employer] of Occupational Health - Occupational Stress Questionnaire    Feeling of Stress: Not at all  Social Connections: Unknown (08/02/2024)   Social Connection and Isolation Panel    Frequency of Communication with Friends and Family: Patient declined    Frequency of Social Gatherings with Friends and Family: Patient declined    Attends Religious Services: Patient declined    Active Member of Clubs or Organizations: Patient declined    Attends Banker Meetings: Not on file    Marital Status: Married  Intimate Partner Violence: Not At Risk (05/31/2024)   Epic    Fear of Current or Ex-Partner: No    Emotionally Abused: No    Physically Abused: No    Sexually Abused: No  Depression (PHQ2-9): Low Risk (08/02/2024)   Depression (PHQ2-9)    PHQ-2 Score: 0  Alcohol Screen: Low Risk (08/02/2024)   Alcohol Screen    Last Alcohol Screening Score (AUDIT): 1  Housing: Patient Declined (08/02/2024)   Epic    Unable to Pay for Housing in the Last Year: Patient declined    Number of Times Moved in the Last Year: Not on file    Homeless in the Last Year: Patient declined  Utilities: Not At Risk (05/31/2024)   Epic    Threatened with loss of utilities: No  Health Literacy: Not on file    Outpatient Encounter Medications as of 08/02/2024  Medication Sig   Albuterol -Budesonide (AIRSUPRA ) 90-80 MCG/ACT AERO Inhale 2 puffs into the lungs every 4 (four) hours as needed.   aspirin 81 MG chewable tablet Chew by mouth.   Blood Pressure Monitoring (OMRON 3 SERIES BP MONITOR) DEVI Use to self monitor blood pressure as directed.   butalbital-acetaminophen-caffeine (FIORICET) 50-325-40 MG tablet Take 1 tablet by mouth 2 (two) times daily.   cetirizine  (ZYRTEC) 10 MG tablet Take by mouth.   cyclobenzaprine  (FLEXERIL ) 10 MG tablet TAKE 1 TABLET BY MOUTH THREE TIMES A DAY AS NEEDED FOR MUSCLE SPASM   dapagliflozin  propanediol (FARXIGA ) 5 MG TABS tablet Take 1 tablet (5 mg total) by mouth daily.   fluticasone  (FLONASE ) 50 MCG/ACT nasal spray Place two sprays in each nostril once daily   hydrALAZINE  (APRESOLINE ) 10 MG tablet Take 1 tablet (10 mg total) by mouth 3 (three) times daily.   MELATONIN PO Take by mouth.   NON FORMULARY OmegaCo3   NON FORMULARY Liqua-D (K-87)   ondansetron  (ZOFRAN -ODT) 4 MG disintegrating tablet Take 1 tablet (4 mg total) by mouth every 8 (eight) hours as needed for nausea or vomiting.   OVER THE COUNTER MEDICATION Thyroid  support   OVER THE COUNTER MEDICATION Thyroid  support   pregabalin (LYRICA) 25 MG capsule Take 25 mg by mouth 2 (two) times  daily.   Red Yeast Rice 600 MG CAPS Take 2,400 mg by mouth daily.   SUMAtriptan  (IMITREX ) 100 MG tablet Take 1 tablet (100 mg total) by mouth once for 1 dose. May repeat in 2 hours if headache persists or recurs.   tirzepatide  (MOUNJARO ) 5 MG/0.5ML Pen Inject 5 mg into the skin once a week.   [DISCONTINUED] tirzepatide  (MOUNJARO ) 7.5 MG/0.5ML Pen Inject 7.5 mg into the skin once a week.   metoprolol  tartrate (LOPRESSOR ) 25 MG tablet Take 1 tablet (25 mg total) by mouth daily.   [DISCONTINUED] metoprolol  tartrate (LOPRESSOR ) 100 MG tablet Take one (1) 100 mg tablet 2 hours prior to scan   [DISCONTINUED] metoprolol  tartrate (LOPRESSOR ) 25 MG tablet Take 1 tablet (25 mg total) by mouth 2 (two) times daily. (Patient not taking: Reported on 08/02/2024)   No facility-administered encounter medications on file as of 08/02/2024.    Allergies[1]  Pertinent ROS per HPI, otherwise unremarkable      Objective:  BP 138/84   Pulse 81   Temp (!) 97.5 F (36.4 C)   Ht 5' 7 (1.702 m)   Wt 200 lb 6.4 oz (90.9 kg)   SpO2 96%   BMI 31.39 kg/m    Wt Readings from Last 3 Encounters:   08/02/24 200 lb 6.4 oz (90.9 kg)  06/30/24 196 lb 12.8 oz (89.3 kg)  06/20/24 202 lb (91.6 kg)    Physical Exam Vitals and nursing note reviewed.  Constitutional:      Appearance: Normal appearance. She is obese.  HENT:     Head: Normocephalic and atraumatic.     Nose: Nose normal.     Mouth/Throat:     Mouth: Mucous membranes are moist.  Eyes:     Conjunctiva/sclera: Conjunctivae normal.     Pupils: Pupils are equal, round, and reactive to light.  Cardiovascular:     Rate and Rhythm: Normal rate and regular rhythm.     Heart sounds: Normal heart sounds.  Pulmonary:     Effort: Pulmonary effort is normal.     Breath sounds: Normal breath sounds.  Musculoskeletal:     Cervical back: Neck supple.     Right lower leg: No edema.     Left lower leg: No edema.  Skin:    General: Skin is warm and dry.     Capillary Refill: Capillary refill takes less than 2 seconds.  Neurological:     General: No focal deficit present.     Mental Status: She is alert and oriented to person, place, and time.  Psychiatric:        Mood and Affect: Mood normal.        Behavior: Behavior normal.        Thought Content: Thought content normal.        Judgment: Judgment normal.      Results for orders placed or performed in visit on 06/30/24  Comp Met (CMET)   Collection Time: 06/30/24  4:22 PM  Result Value Ref Range   Glucose 143 (H) 70 - 99 mg/dL   BUN 19 8 - 27 mg/dL   Creatinine, Ser 9.07 0.57 - 1.00 mg/dL   eGFR 71 >40 fO/fpw/8.26   BUN/Creatinine Ratio 21 12 - 28   Sodium 138 134 - 144 mmol/L   Potassium 4.1 3.5 - 5.2 mmol/L   Chloride 100 96 - 106 mmol/L   CO2 22 20 - 29 mmol/L   Calcium 9.7 8.7 - 10.3 mg/dL   Total  Protein 7.5 6.0 - 8.5 g/dL   Albumin 4.6 3.8 - 4.9 g/dL   Globulin, Total 2.9 1.5 - 4.5 g/dL   Bilirubin Total 0.4 0.0 - 1.2 mg/dL   Alkaline Phosphatase 97 49 - 135 IU/L   AST 19 0 - 40 IU/L   ALT 14 0 - 32 IU/L  HgB A1c   Collection Time: 06/30/24  4:22 PM   Result Value Ref Range   Hgb A1c MFr Bld 9.9 (H) 4.8 - 5.6 %   Est. average glucose Bld gHb Est-mCnc 237 mg/dL  Lipoprotein A (LPA)   Collection Time: 06/30/24  4:22 PM  Result Value Ref Range   Lipoprotein (a) 11.3 <75.0 nmol/L       Pertinent labs & imaging results that were available during my care of the patient were reviewed by me and considered in my medical decision making.  Assessment & Plan:  Jenny Willis was seen today for results.  Diagnoses and all orders for this visit:  Hyperlipidemia associated with type 2 diabetes mellitus (HCC) -     tirzepatide  (MOUNJARO ) 5 MG/0.5ML Pen; Inject 5 mg into the skin once a week.  Hypertension associated with diabetes (HCC) -     metoprolol  tartrate (LOPRESSOR ) 25 MG tablet; Take 1 tablet (25 mg total) by mouth daily. -     tirzepatide  (MOUNJARO ) 5 MG/0.5ML Pen; Inject 5 mg into the skin once a week.  NSTEMI (non-ST elevated myocardial infarction) (HCC) -     metoprolol  tartrate (LOPRESSOR ) 25 MG tablet; Take 1 tablet (25 mg total) by mouth daily. -     tirzepatide  (MOUNJARO ) 5 MG/0.5ML Pen; Inject 5 mg into the skin once a week.  Type 2 diabetes mellitus with other specified complication, without long-term current use of insulin  (HCC) -     tirzepatide  (MOUNJARO ) 5 MG/0.5ML Pen; Inject 5 mg into the skin once a week.  Acquired hypothyroidism Labs reviewed   Chronic bilateral low back pain without sciatica Has been well controlled.      Coronary artery disease with history of NSTEMI NSTEMI diagnosed at [facility] with elevated high sensitivity troponin indicating myocardial injury. Nuclear medicine showed a fixed anterior defect without acute injury. CT coronary angiography revealed minimal stenosis in the LAD and left circumflex. Cardiac calcium score was zero. Possible flow-limiting lesion in the proximal ramus. Symptoms include chest pain and shortness of breath. Hydralazine  effective in controlling symptoms,  metoprolol  previously ineffective. - Continue hydralazine  for blood pressure control. - Resume metoprolol  once daily during the day, not at bedtime. - Will consider cardiac catheterization if symptoms persist despite medication. - Will consider amlodipine  if metoprolol  is ineffective.  Hypertension associated with type 2 diabetes mellitus Blood pressure previously uncontrolled with metoprolol , now well-managed with hydralazine . Current regimen includes hydralazine  and metoprolol . - Continue hydralazine  for blood pressure control. - Resume metoprolol  once daily during the day, not at bedtime.  Type 2 diabetes mellitus with hyperlipidemia A1c improved from 9.9 to 7.3. Experiencing adverse effects with Mounjaro  at 7.5 mg, including nausea and vomiting. Triglycerides and LDL levels have decreased, HDL is improving. - Reduced Mounjaro  dose to 5 mg to mitigate side effects. - Continue monitoring lipid levels and A1c.  Acquired hypothyroidism Currently taking thyroid  support, but considering returning to levothyroxine  due to concerns about effectiveness of current regimen. - Will consider resuming levothyroxine  if thyroid  support is ineffective.  Chronic low back pain Considering stem cell therapy for management. Engaging in [place] and using a shaper machine for  exercise. - Continue Tai Chi and shaper machine exercises. - Consider stem cell therapy for back pain management.          Continue all other maintenance medications.  Follow up plan: Return in about 3 months (around 10/31/2024), or if symptoms worsen or fail to improve, for DM.   Continue healthy lifestyle choices, including diet (rich in fruits, vegetables, and lean proteins, and low in salt and simple carbohydrates) and exercise (at least 30 minutes of moderate physical activity daily).  Educational handout given for DM  The above assessment and management plan was discussed with the patient. The patient verbalized  understanding of and has agreed to the management plan. Patient is aware to call the clinic if they develop any new symptoms or if symptoms persist or worsen. Patient is aware when to return to the clinic for a follow-up visit. Patient educated on when it is appropriate to go to the emergency department.   Jenny Bruns, FNP-C [facility] [phone]     [1]  Allergies Allergen Reactions   Gabapentin Shortness Of Breath   Misc. Sulfonamide Containing Compounds Hives    REACTION: GI upset   Penicillins Hives    REACTION: childhood reaction wtih Scarlett Fever  Other reaction(s): Other (See Comments) unrecalled REACTION: childhood reaction wtih Scarlett Fever REACTION: childhood reaction wtih Scarlett Fever  Other Reaction(s): almost killed me   Simvastatin Other (See Comments)   Sulfa Antibiotics Hives    REACTION: GI upset  Other Reaction(s): extremly sick   Topiramate Other (See Comments)   Nitrofurantoin Macrocrystal Other (See Comments)    Muscle spasms  Other reaction(s): Other, Other (See Comments) Muscle spasms  Muscle spasms   Codeine    Metformin Other (See Comments)    REACTION: diarrhea   Sulfonamide Derivatives     REACTION: GI upset   Lisinopril Other (See Comments) and Cough

## 2024-08-02 NOTE — Patient Instructions (Signed)

## 2024-08-03 NOTE — Telephone Encounter (Signed)
 Spoke with pt regarding her results and follow up appointments. Pt was told that Jenny Willis is no longer with [facility] but that I could make an appointment with another APP. Pt declined. Pt was very upset that Jenny Willis was not available. When I attempted to make a follow up appointment in January with Dr. Lavona the pt became infuriated when told that he did not have any appointments available in [place] until February. Pt was told that Dr. Lavona does not have clinic very often in [place]. Pt stated that is a waste of space. Pt stated she does not feel like she is being well taken care of and mentioned many times that she can reach her PCP very easily and will follow up with her and find another cardiologist closer to her and that cares about her wellbeing. Pt then said that she wanted an appointment with an APP here at [facility]. An appointment was made with Jenny Willis on 1/28. Pt stated she would not come but wanted the appointment anyway. Pt then hung up on me.

## 2024-08-03 NOTE — Telephone Encounter (Signed)
-----   Message from [name], MD sent at 07/28/2024  1:58 PM EST ----- Results reviewed.  She appears to have some obstructive disease in the ramus branch.   She needs medical management to begin with since she apparently is asymptomatic.  I will ask that she be seen  sooner by Delon Hoover to discuss these results since I have not seen her in a couple of years.  I would be happy to see her after that when I am back in the office in January.  Jenny Willis   ----- Message ----- From: Shlomo Wilbert SAUNDERS, MD Sent: 07/27/2024   7:14 PM EST To: [name], MD Subject: abnormal FFR                                   [name] ordered this CTA - she has a high grade lesion in the prox Ramus.    I recommended cath vs. Initial trial of antianginal therapy>>just wanted to let you know  [name]

## 2024-08-31 NOTE — Progress Notes (Unsigned)
" °  Cardiology Office Note   Date:  08/31/2024  ID:  Jenny Willis, DOB Dec 22, 1965, MRN [id number] PCP: Jenny Rock HERO, FNP  [facility] Providers Cardiologist:  Lynwood Schilling, MD   History of Present Illness Jenny Willis is a 59 y.o. female with a past medical history of CAD, HTN, type 2 DM, HLD, OSA on CPAP. Presents today for follow up after coronary CTA   Patient previously followed by [facility] cardiology. Established with them in 2021 for evaluation of atypical chest pain. Echocardiogram showed EF 60%. Stress test showed no evidence of ischemia.   She transferred care to Dr. 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 More recently she was admitted to [facility] on 05/24/2024 with hypertensive crisis and type II non-STEMI TnT-Gen 5 are 43> 69 (26) > 75 (32)  >> started on a Cardene drip, given nitroglycerin , she underwent echocardiogram revealing a normal EF, mild aortic valve sclerosis, she underwent a renal duplex revealing no evidence of RAS, she underwent MPI which revealed no evidence of ischemia.  It was suggested she start HCTZ and nifedipine at discharge however there is notation indicating she preferred to take her herbal supplements.  Seen on 06/30/24 for follow up. Recommended coronary CTA which showed a coronary calcium score of 0. There was possible flow limiting lesion in the proximal ramus. Recommended trial of antianginal therapy vs cath.   CAD  - Coronary CTA in 07/2024 showed a possible flow limiting lesion in the proximal ramus, coronary calcium score of 0. Recommended trial of antianginal therapy vs cath  -  - Continue ASA 81 mg daily  - Continue metoprolol  tartrate 25 mg BID  - Start imdur ***  - Start statin ***   HTN   HLD  - Lipid panel  from 07/31/24 showed LDL 126, HDL 42, triglycerides 171, total cholesterol 198  - Start creator 20 mg daily  - Repeat lipids, lfts in 8 weeks   OSA on CPAP   Aortic dilation   - Coronary CTA from 07/2024 showed borderline dilation of the ascending aorta measuring 4 cm  - recommend annual imaging ***   ROS: ***  Studies Reviewed      *** Risk Assessment/Calculations {Does this patient have ATRIAL FIBRILLATION?[phone]} No BP recorded.  {Refresh Note OR Click here to enter BP  :1}***       Physical Exam VS:  There were no vitals taken for this visit.       Wt Readings from Last 3 Encounters:  08/02/24 200 lb 6.4 oz (90.9 kg)  06/30/24 196 lb 12.8 oz (89.3 kg)  06/20/24 202 lb (91.6 kg)    GEN: Well nourished, well developed in no acute distress NECK: No JVD; No carotid bruits CARDIAC: ***RRR, no murmurs, rubs, gallops RESPIRATORY:  Clear to auscultation without rales, wheezing or rhonchi  ABDOMEN: Soft, non-tender, non-distended EXTREMITIES:  No edema; No deformity   ASSESSMENT AND PLAN ***    {Are you ordering a CV Procedure (e.g. stress test, cath, DCCV, TEE, etc)?   Press F2        :[id number]}  Dispo: ***  Signed, Rollo FABIENE Louder, PA-C   "

## 2024-09-13 ENCOUNTER — Ambulatory Visit: Payer: Medicare (Managed Care) | Admitting: Cardiology

## 2024-10-31 ENCOUNTER — Ambulatory Visit: Payer: Medicare (Managed Care) | Admitting: Cardiology

## 2024-10-31 ENCOUNTER — Ambulatory Visit: Payer: Medicare (Managed Care) | Admitting: Family Medicine
